# Patient Record
Sex: Female | Born: 1975 | Race: Black or African American | Hispanic: No | Marital: Single | Smoking: Former smoker
Health system: Southern US, Community
[De-identification: ages and names within clinical notes are randomized; demographics above are authoritative.]

## PROBLEM LIST (undated history)

## (undated) DIAGNOSIS — J45909 Unspecified asthma, uncomplicated: Secondary | ICD-10-CM

## (undated) DIAGNOSIS — E119 Type 2 diabetes mellitus without complications: Secondary | ICD-10-CM

## (undated) DIAGNOSIS — I10 Essential (primary) hypertension: Secondary | ICD-10-CM

## (undated) DIAGNOSIS — N83209 Unspecified ovarian cyst, unspecified side: Secondary | ICD-10-CM

## (undated) DIAGNOSIS — H409 Unspecified glaucoma: Secondary | ICD-10-CM

## (undated) DIAGNOSIS — H548 Legal blindness, as defined in USA: Secondary | ICD-10-CM

## (undated) HISTORY — PX: EYE SURGERY: SHX253

## (undated) HISTORY — DX: Unspecified ovarian cyst, unspecified side: N83.209

## (undated) HISTORY — DX: Legal blindness, as defined in USA: H54.8

## (undated) HISTORY — DX: Essential (primary) hypertension: I10

## (undated) HISTORY — DX: Unspecified glaucoma: H40.9

## (undated) HISTORY — DX: Type 2 diabetes mellitus without complications: E11.9

---

## 2001-10-09 HISTORY — PX: TUBAL LIGATION: SHX77

## 2012-10-03 DIAGNOSIS — H401190 Primary open-angle glaucoma, unspecified eye, stage unspecified: Secondary | ICD-10-CM | POA: Insufficient documentation

## 2012-12-17 DIAGNOSIS — Z961 Presence of intraocular lens: Secondary | ICD-10-CM | POA: Insufficient documentation

## 2013-03-31 DIAGNOSIS — I10 Essential (primary) hypertension: Secondary | ICD-10-CM | POA: Insufficient documentation

## 2013-03-31 DIAGNOSIS — J45909 Unspecified asthma, uncomplicated: Secondary | ICD-10-CM | POA: Insufficient documentation

## 2013-03-31 DIAGNOSIS — E139 Other specified diabetes mellitus without complications: Secondary | ICD-10-CM | POA: Insufficient documentation

## 2013-11-30 ENCOUNTER — Emergency Department: Payer: Self-pay | Admitting: Emergency Medicine

## 2014-02-22 ENCOUNTER — Emergency Department: Payer: Self-pay | Admitting: Emergency Medicine

## 2014-02-22 LAB — URINALYSIS, COMPLETE
BACTERIA: NONE SEEN
Bilirubin,UR: NEGATIVE
Blood: NEGATIVE
GLUCOSE, UR: NEGATIVE mg/dL (ref 0–75)
Ketone: NEGATIVE
NITRITE: NEGATIVE
PH: 6 (ref 4.5–8.0)
PROTEIN: NEGATIVE
SPECIFIC GRAVITY: 1.021 (ref 1.003–1.030)
Squamous Epithelial: 39
WBC UR: 4 /HPF (ref 0–5)

## 2014-02-22 LAB — COMPREHENSIVE METABOLIC PANEL
ALT: 16 U/L (ref 12–78)
ANION GAP: 8 (ref 7–16)
AST: 14 U/L — AB (ref 15–37)
Albumin: 3.9 g/dL (ref 3.4–5.0)
Alkaline Phosphatase: 120 U/L — ABNORMAL HIGH
BUN: 10 mg/dL (ref 7–18)
Bilirubin,Total: 0.4 mg/dL (ref 0.2–1.0)
CHLORIDE: 101 mmol/L (ref 98–107)
CO2: 27 mmol/L (ref 21–32)
CREATININE: 0.78 mg/dL (ref 0.60–1.30)
Calcium, Total: 10 mg/dL (ref 8.5–10.1)
EGFR (African American): >60
Glucose: 94 mg/dL (ref 65–99)
Osmolality: 271 (ref 275–301)
Potassium: 3 mmol/L — ABNORMAL LOW (ref 3.5–5.1)
SODIUM: 136 mmol/L (ref 136–145)
Total Protein: 8.3 g/dL — ABNORMAL HIGH (ref 6.4–8.2)

## 2014-02-22 LAB — CBC WITH DIFFERENTIAL/PLATELET
Basophil #: 0 10*3/uL (ref 0.0–0.1)
Basophil %: 0.6 %
EOS PCT: 1.5 %
Eosinophil #: 0.1 10*3/uL (ref 0.0–0.7)
HCT: 38.6 % (ref 35.0–47.0)
HGB: 13 g/dL (ref 12.0–16.0)
LYMPHS PCT: 21.7 %
Lymphocyte #: 1.5 10*3/uL (ref 1.0–3.6)
MCH: 29.7 pg (ref 26.0–34.0)
MCHC: 33.7 g/dL (ref 32.0–36.0)
MCV: 88 fL (ref 80–100)
MONOS PCT: 9.1 %
Monocyte #: 0.6 x10 3/mm (ref 0.2–0.9)
Neutrophil #: 4.6 10*3/uL (ref 1.4–6.5)
Neutrophil %: 67.1 %
Platelet: 320 10*3/uL (ref 150–440)
RBC: 4.38 10*6/uL (ref 3.80–5.20)
RDW: 14.4 % (ref 11.5–14.5)
WBC: 6.9 10*3/uL (ref 3.6–11.0)

## 2014-02-22 LAB — PREGNANCY, URINE: Pregnancy Test, Urine: NEGATIVE m[IU]/mL

## 2014-05-20 ENCOUNTER — Emergency Department: Payer: Self-pay | Admitting: Student

## 2014-11-03 DIAGNOSIS — H26499 Other secondary cataract, unspecified eye: Secondary | ICD-10-CM | POA: Insufficient documentation

## 2015-07-05 ENCOUNTER — Emergency Department
Admission: EM | Admit: 2015-07-05 | Discharge: 2015-07-05 | Disposition: A | Discharge status: Home / Self Care | Payer: Medicare Other | Attending: Emergency Medicine | Admitting: Emergency Medicine

## 2015-07-05 ENCOUNTER — Encounter: Payer: Self-pay | Admitting: Emergency Medicine

## 2015-07-05 ENCOUNTER — Emergency Department: Payer: Medicare Other

## 2015-07-05 DIAGNOSIS — N83201 Unspecified ovarian cyst, right side: Secondary | ICD-10-CM

## 2015-07-05 DIAGNOSIS — Z3202 Encounter for pregnancy test, result negative: Secondary | ICD-10-CM | POA: Insufficient documentation

## 2015-07-05 DIAGNOSIS — M25551 Pain in right hip: Secondary | ICD-10-CM | POA: Diagnosis not present

## 2015-07-05 DIAGNOSIS — Z79899 Other long term (current) drug therapy: Secondary | ICD-10-CM | POA: Diagnosis not present

## 2015-07-05 DIAGNOSIS — N8320 Unspecified ovarian cysts: Secondary | ICD-10-CM | POA: Insufficient documentation

## 2015-07-05 DIAGNOSIS — R109 Unspecified abdominal pain: Secondary | ICD-10-CM

## 2015-07-05 DIAGNOSIS — R102 Pelvic and perineal pain: Secondary | ICD-10-CM | POA: Diagnosis present

## 2015-07-05 HISTORY — DX: Unspecified asthma, uncomplicated: J45.909

## 2015-07-05 LAB — URINALYSIS COMPLETE WITH MICROSCOPIC (ARMC ONLY)
BILIRUBIN URINE: NEGATIVE
Bacteria, UA: NONE SEEN
Glucose, UA: NEGATIVE mg/dL
Hgb urine dipstick: NEGATIVE
Ketones, ur: NEGATIVE mg/dL
LEUKOCYTES UA: NEGATIVE
Nitrite: NEGATIVE
Protein, ur: NEGATIVE mg/dL
Specific Gravity, Urine: 1.019 (ref 1.005–1.030)
pH: 6 (ref 5.0–8.0)

## 2015-07-05 LAB — POCT PREGNANCY, URINE: Preg Test, Ur: NEGATIVE

## 2015-07-05 MED ORDER — TRAMADOL HCL 50 MG PO TABS: 50.0000 mg | ORAL_TABLET | Freq: Four times a day (QID) | ORAL | Status: AC | PRN

## 2015-07-05 MED ORDER — TRAMADOL HCL 50 MG PO TABS: 50.0000 mg | ORAL_TABLET | Freq: Four times a day (QID) | ORAL | Status: DC | PRN

## 2015-07-05 MED ORDER — KETOROLAC TROMETHAMINE 60 MG/2ML IM SOLN
60.0000 mg | Freq: Once | INTRAMUSCULAR | Status: AC
  Administered 2015-07-05: 60 mg via INTRAMUSCULAR
  Filled 2015-07-05: qty 2

## 2015-07-05 MED ORDER — IBUPROFEN 800 MG PO TABS: 800.0000 mg | ORAL_TABLET | Freq: Three times a day (TID) | ORAL | Status: DC | PRN

## 2015-07-05 NOTE — ED Provider Notes (Signed)
[facility] Emergency Department Provider Note  ____________________________________________  Time seen: Approximately 2:12 PM  I have reviewed the triage vital signs and the nursing notes.   HISTORY  Chief Complaint Hip Pain    HPI Lauren Bradford is a 39 y.o. female patient complaining of right lateral pelvic pain in set of hip pain. Patient stated onset was 6 PM last night. Patient denies any provocative incident for this pain. Patient stated this increased urinary frequency but denies dysuria hesitancy or vaginal discharge. Patient states she has a similar complaint about a year ago.   Past Medical History  Diagnosis Date  . Asthma     There are no active problems to display for this patient.   History reviewed. No pertinent past surgical history.  Current Outpatient Rx  Name  Route  Sig  Dispense  Refill  . albuterol (PROVENTIL HFA;VENTOLIN HFA) 108 (90 BASE) MCG/ACT inhaler   Inhalation   Inhale into the lungs every 6 (six) hours as needed for wheezing or shortness of breath.         Marland Kitchen ibuprofen (ADVIL,MOTRIN) 800 MG tablet   Oral   Take 1 tablet (800 mg total) by mouth every 8 (eight) hours as needed for moderate pain.   15 tablet   0   . traMADol (ULTRAM) 50 MG tablet   Oral   Take 1 tablet (50 mg total) by mouth every 6 (six) hours as needed for moderate pain.   12 tablet   0   . traMADol (ULTRAM) 50 MG tablet   Oral   Take 1 tablet (50 mg total) by mouth every 6 (six) hours as needed.   20 tablet   0     Allergies Review of patient's allergies indicates no known allergies.  No family history on file.  Social History Social History  Substance Use Topics  . Smoking status: Never Smoker   . Smokeless tobacco: None  . Alcohol Use: No    Review of Systems Constitutional: No fever/chills Eyes: No visual changes. ENT: No sore throat. Cardiovascular: Denies chest pain. Respiratory: Denies shortness of  breath. Gastrointestinal: No abdominal pain.  No nausea, no vomiting.  No diarrhea.  No constipation. Genitourinary: Pelvic pain Musculoskeletal: Negative for back pain. Skin: Negative for rash. Neurological: Negative for headaches, focal weakness or numbness. 10-point ROS otherwise negative.  ____________________________________________   PHYSICAL EXAM:  VITAL SIGNS: ED Triage Vitals  Enc Vitals Group     BP 07/05/15 1355 109/72 mmHg     Pulse Rate 07/05/15 1355 78     Resp 07/05/15 1355 20     Temp 07/05/15 1355 98 F (36.7 C)     Temp Source 07/05/15 1355 Oral     SpO2 07/05/15 1355 98 %     Weight 07/05/15 1355 207 lb (93.895 kg)     Height 07/05/15 1355  (1.651 m)     Head Cir --      Peak Flow --      Pain Score --      Pain Loc --      Pain Edu? --      Excl. in GC? --     Constitutional: Alert and oriented. Well appearing and in no acute distress. Eyes: Conjunctivae are normal. PERRL. EOMI. Head: Atraumatic. Nose: No congestion/rhinnorhea. Mouth/Throat: Mucous membranes are moist.  Oropharynx non-erythematous. Neck: No stridor.   Hematological/Lymphatic/Immunilogical: No cervical lymphadenopathy. Cardiovascular: Normal rate, regular rhythm. Grossly normal heart sounds.  Good  peripheral circulation. Respiratory: Normal respiratory effort.  No retractions. Lungs CTAB. Gastrointestinal: Soft and nontender. No distention. No abdominal bruits. No CVA tenderness. Musculoskeletal: No lower extremity tenderness nor edema.  No joint effusions. Neurologic:  Normal speech and language. No gross focal neurologic deficits are appreciated. No gait instability. Skin:  Skin is warm, dry and intact. No rash noted. Psychiatric: Mood and affect are normal. Speech and behavior are normal.  ____________________________________________   LABS (all labs ordered are listed, but only abnormal results are displayed)  Labs Reviewed  URINALYSIS COMPLETEWITH MICROSCOPIC ([facility]  ONLY) - Abnormal; Notable for the following:    Color, Urine YELLOW (*)    APPearance CLEAR (*)    Squamous Epithelial / LPF 0-5 (*)    All other components within normal limits  POC URINE PREG, ED  POCT PREGNANCY, URINE   ____________________________________________  EKG   ____________________________________________  RADIOLOGY  Ultrasound this is ovarian. ____________________________________________   PROCEDURES  Procedure(s) performed: None  Critical Care performed: No  ____________________________________________   INITIAL IMPRESSION / ASSESSMENT AND PLAN / ED COURSE  Pertinent labs & imaging results that were available during my care of the patient were reviewed by me and considered in my medical decision making (see chart for details).  Discussed ultrasound findings with palpation. Advised patient follow-up with GYN within the next 2-3 days. Patient given a work note for 2 days. Patient given prescription for tramadol and Motrin take as needed for pain. ____________________________________________   FINAL CLINICAL IMPRESSION(S) / ED DIAGNOSES  Final diagnoses:  Right ovarian cyst      Joni Reining, PA-C 07/05/15 1717  Joni Reining, PA-C 07/05/15 1718  Emily Filbert, MD 07/06/15 [phone]

## 2015-07-05 NOTE — ED Notes (Signed)
States she developed pain to right hip area  Yesterday   Denies any injury

## 2015-07-21 ENCOUNTER — Encounter: Payer: Self-pay | Admitting: Obstetrics and Gynecology

## 2015-07-21 ENCOUNTER — Ambulatory Visit (INDEPENDENT_AMBULATORY_CARE_PROVIDER_SITE_OTHER): Payer: Medicare Other | Admitting: Obstetrics and Gynecology

## 2015-07-21 VITALS — BP 111/76 | HR 80 | Ht 65.0 in | Wt 211.2 lb

## 2015-07-21 DIAGNOSIS — K5901 Slow transit constipation: Secondary | ICD-10-CM

## 2015-07-21 DIAGNOSIS — R102 Pelvic and perineal pain: Secondary | ICD-10-CM

## 2015-07-21 DIAGNOSIS — N83201 Unspecified ovarian cyst, right side: Secondary | ICD-10-CM | POA: Insufficient documentation

## 2015-07-21 MED ORDER — MAGNESIUM HYDROXIDE 400 MG/5ML PO SUSP: 30.0000 mL | Freq: Every day | ORAL | Status: DC | PRN

## 2015-07-21 MED ORDER — DOCUSATE SODIUM 100 MG PO CAPS: 100.0000 mg | ORAL_CAPSULE | Freq: Two times a day (BID) | ORAL | Status: DC | PRN

## 2015-07-21 NOTE — Progress Notes (Signed)
GYNECOLOGY PROGRESS NOTE  Subjective:    Patient ID: Lauren Bradford, female    DOB: September 11, 1976, 39 y.o.   MRN: [id number]  HPI  Patient is a 39 y.o. [id number] female who presents for f/u from ER visit for right pelvic pain/hip pain.  Was diagnosed with right ovarian cyst. Reports that the she was prescribed tramadol for pain, however takes sparingly as it is very strong and makes her drowsy.   Past Medical History  Diagnosis Date  . Asthma   . Ovarian cyst ER    right  . Advanced stage glaucoma     limited vision  . Hypertension    Family History  Problem Relation Age of Onset  . Diabetes Mother   . Diabetes Father   . Cancer Maternal Grandfather     lung  . Hyperlipidemia Mother   . Hypertension Mother   . Hypertension Father   . Stroke Father     Past Surgical History  Procedure Laterality Date  . Cesarean section  2002  . Tubal ligation  2003  . Eye surgery      several    Social History   Social History  . Marital Status: Single    Spouse Name: N/A  . Number of Children: N/A  . Years of Education: N/A   Occupational History  . sew    Social History Main Topics  . Smoking status: Former Games developermoker  . Smokeless tobacco: Never Used  . Alcohol Use: No  . Drug Use: No  . Sexual Activity:    Partners: Male   Other Topics Concern  . Not on file   Social History Narrative    Current Outpatient Prescriptions on File Prior to Visit  Medication Sig Dispense Refill  . albuterol (PROVENTIL HFA;VENTOLIN HFA) 108 (90 BASE) MCG/ACT inhaler Inhale into the lungs every 6 (six) hours as needed for wheezing or shortness of breath.    Marland Kitchen. ibuprofen (ADVIL,MOTRIN) 800 MG tablet Take 1 tablet (800 mg total) by mouth every 8 (eight) hours as needed for moderate pain. 15 tablet 0  . traMADol (ULTRAM) 50 MG tablet Take 1 tablet (50 mg total) by mouth every 6 (six) hours as needed. 20 tablet 0   No current facility-administered medications on file prior to visit.    No Known  Allergies   Review of Systems Constitutional: negative for chills, fatigue, fevers and sweats Eyes: negative for irritation, redness and visual disturbance Ears, nose, mouth, throat, and face: negative for hearing loss, nasal congestion, snoring and tinnitus Respiratory: negative for asthma, cough, sputum Cardiovascular: negative for chest pain, dyspnea, exertional chest pressure/discomfort, irregular heart beat, palpitations and syncope Gastrointestinal: positive for abdominal pain, constipation (nots that it has been over 1 week since last BM, and sometimes goes up to 2 weeks), negative for  nausea and vomiting Genitourinary: negative for abnormal menstrual periods, genital lesions, sexual problems and vaginal discharge, dysuria and urinary incontinence Integument/breast: negative for breast lump, breast tenderness and nipple discharge Hematologic/lymphatic: negative for bleeding and easy bruising Musculoskeletal:negative for back pain and muscle weakness Neurological: negative for dizziness, headaches, vertigo and weakness Endocrine: negative for diabetic symptoms including polydipsia, polyuria and skin dryness Allergic/Immunologic: negative for hay fever and urticaria     Pelvic Ultrasound 07/05/2015:  FINDINGS: Uterus - Measurements: 8.3 x 5.1 x 6.4 cm. No fibroids or other mass visualized.  Endometrium - Thickness: Normal thickness, 5 mm. No focal abnormality visualized.  Right ovary - Measurements: 4.4 x 2.5 x 3.3 cm.  Echogenic area within the right ovary measures 6 mm compared to 1.4 mm previously. This may represent a calcification or fat related to a small dermoid. This has decreased in size since prior study.  Left ovary - Measurements: 3.3 x 1.6 x 3.3 cm. Normal appearance/no adnexal mass.  Other findings - No free fluid.  IMPRESSION: No acute findings. Echogenic focus within the right ovary measuring 6 mm on today's study compared to 1.4 cm previously, possibly  calcifications versus small dermoid.   Objective:   Last menstrual period 06/24/2015. General appearance: alert and no distress Abdomen: soft, non-tender; bowel sounds normal; no masses,  no organomegaly Pelvic: deferred Extremities: extremities normal, atraumatic, no cyanosis or edema Neurologic: Grossly normal   Assessment:   Small right ovarian cyst, dermoid vs calcification.  Constipation  Plan:   Discussion had with patient that cyst is not likely cause of pain due to size.  Further discussion had with patient, and noted that she is currently dealing with constipation (has not had BM in over 1 week). Discussed that this could also be a cause of abdominal pain.  Discussed high fiber diet, use of stool softeners, limiting pain medications as much as possible.   To f/u in 1 week if no resolution of symptoms.    Hildred Laser, MD [facility]

## 2016-09-22 ENCOUNTER — Emergency Department
Admission: EM | Admit: 2016-09-22 | Discharge: 2016-09-22 | Disposition: A | Discharge status: Home / Self Care | Payer: Medicare Other | Attending: Emergency Medicine | Admitting: Emergency Medicine

## 2016-09-22 ENCOUNTER — Emergency Department: Payer: Medicare Other

## 2016-09-22 ENCOUNTER — Encounter: Payer: Self-pay | Admitting: Emergency Medicine

## 2016-09-22 DIAGNOSIS — Z87891 Personal history of nicotine dependence: Secondary | ICD-10-CM | POA: Insufficient documentation

## 2016-09-22 DIAGNOSIS — R0600 Dyspnea, unspecified: Secondary | ICD-10-CM | POA: Insufficient documentation

## 2016-09-22 DIAGNOSIS — E876 Hypokalemia: Secondary | ICD-10-CM | POA: Diagnosis not present

## 2016-09-22 DIAGNOSIS — R42 Dizziness and giddiness: Secondary | ICD-10-CM | POA: Diagnosis present

## 2016-09-22 DIAGNOSIS — I10 Essential (primary) hypertension: Secondary | ICD-10-CM | POA: Insufficient documentation

## 2016-09-22 DIAGNOSIS — J45909 Unspecified asthma, uncomplicated: Secondary | ICD-10-CM | POA: Diagnosis not present

## 2016-09-22 DIAGNOSIS — Z79899 Other long term (current) drug therapy: Secondary | ICD-10-CM | POA: Diagnosis not present

## 2016-09-22 LAB — CBC
HEMATOCRIT: 38.6 % (ref 35.0–47.0)
HEMOGLOBIN: 13 g/dL (ref 12.0–16.0)
MCH: 29.2 pg (ref 26.0–34.0)
MCHC: 33.6 g/dL (ref 32.0–36.0)
MCV: 86.9 fL (ref 80.0–100.0)
Platelets: 303 10*3/uL (ref 150–440)
RBC: 4.44 MIL/uL (ref 3.80–5.20)
RDW: 13.8 % (ref 11.5–14.5)
WBC: 4.8 10*3/uL (ref 3.6–11.0)

## 2016-09-22 LAB — MAGNESIUM: Magnesium: 1.8 mg/dL (ref 1.7–2.4)

## 2016-09-22 LAB — URINALYSIS, COMPLETE (UACMP) WITH MICROSCOPIC
BILIRUBIN URINE: NEGATIVE
GLUCOSE, UA: NEGATIVE mg/dL
Hgb urine dipstick: NEGATIVE
KETONES UR: NEGATIVE mg/dL
Leukocytes, UA: NEGATIVE
NITRITE: NEGATIVE
PH: 7 (ref 5.0–8.0)
PROTEIN: NEGATIVE mg/dL
RBC / HPF: NONE SEEN RBC/hpf (ref 0–5)
Specific Gravity, Urine: 1.018 (ref 1.005–1.030)
WBC UA: NONE SEEN WBC/hpf (ref 0–5)

## 2016-09-22 LAB — BASIC METABOLIC PANEL
ANION GAP: 6 (ref 5–15)
BUN: 13 mg/dL (ref 6–20)
CALCIUM: 9.1 mg/dL (ref 8.9–10.3)
CO2: 27 mmol/L (ref 22–32)
Chloride: 100 mmol/L — ABNORMAL LOW (ref 101–111)
Creatinine, Ser: 0.91 mg/dL (ref 0.44–1.00)
GFR calc non Af Amer: >60 mL/min (ref >60)
GLUCOSE: 120 mg/dL — AB (ref 65–99)
POTASSIUM: 2.4 mmol/L — AB (ref 3.5–5.1)
Sodium: 133 mmol/L — ABNORMAL LOW (ref 135–145)

## 2016-09-22 MED ORDER — POTASSIUM CHLORIDE CRYS ER 20 MEQ PO TBCR: 20.0000 meq | EXTENDED_RELEASE_TABLET | Freq: Every day | ORAL | 0 refills | Status: DC

## 2016-09-22 MED ORDER — POTASSIUM CHLORIDE CRYS ER 20 MEQ PO TBCR
40.0000 meq | EXTENDED_RELEASE_TABLET | Freq: Once | ORAL | Status: AC
  Administered 2016-09-22: 40 meq via ORAL
  Filled 2016-09-22: qty 2
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
## 2016-09-22 NOTE — ED Notes (Signed)
D/c inst to pt and family.   

## 2017-01-17 ENCOUNTER — Emergency Department (HOSPITAL_COMMUNITY)
Admission: EM | Admit: 2017-01-17 | Discharge: 2017-01-18 | Disposition: A | Discharge status: Home / Self Care | Payer: Medicare Other | Attending: Emergency Medicine | Admitting: Emergency Medicine

## 2017-01-17 ENCOUNTER — Encounter (HOSPITAL_COMMUNITY): Payer: Self-pay | Admitting: Emergency Medicine

## 2017-01-17 DIAGNOSIS — Y939 Activity, unspecified: Secondary | ICD-10-CM | POA: Insufficient documentation

## 2017-01-17 DIAGNOSIS — Z79899 Other long term (current) drug therapy: Secondary | ICD-10-CM | POA: Diagnosis not present

## 2017-01-17 DIAGNOSIS — S0592XA Unspecified injury of left eye and orbit, initial encounter: Secondary | ICD-10-CM | POA: Diagnosis present

## 2017-01-17 DIAGNOSIS — Z87891 Personal history of nicotine dependence: Secondary | ICD-10-CM | POA: Diagnosis not present

## 2017-01-17 DIAGNOSIS — W208XXA Other cause of strike by thrown, projected or falling object, initial encounter: Secondary | ICD-10-CM | POA: Diagnosis not present

## 2017-01-17 DIAGNOSIS — Y999 Unspecified external cause status: Secondary | ICD-10-CM | POA: Insufficient documentation

## 2017-01-17 DIAGNOSIS — I10 Essential (primary) hypertension: Secondary | ICD-10-CM | POA: Diagnosis not present

## 2017-01-17 DIAGNOSIS — S058X2A Other injuries of left eye and orbit, initial encounter: Secondary | ICD-10-CM

## 2017-01-17 DIAGNOSIS — J45909 Unspecified asthma, uncomplicated: Secondary | ICD-10-CM | POA: Diagnosis not present

## 2017-01-17 DIAGNOSIS — S01112A Laceration without foreign body of left eyelid and periocular area, initial encounter: Secondary | ICD-10-CM | POA: Diagnosis not present

## 2017-01-17 DIAGNOSIS — Y9259 Other trade areas as the place of occurrence of the external cause: Secondary | ICD-10-CM | POA: Diagnosis not present

## 2017-01-17 DIAGNOSIS — T148XXA Other injury of unspecified body region, initial encounter: Secondary | ICD-10-CM

## 2017-01-17 MED ORDER — FLUORESCEIN SODIUM 0.6 MG OP STRP
1.0000 | ORAL_STRIP | Freq: Once | OPHTHALMIC | Status: AC
  Administered 2017-01-17: 1 via OPHTHALMIC
  Filled 2017-01-17: qty 1

## 2017-01-17 MED ORDER — LIDOCAINE-EPINEPHRINE (PF) 2 %-1:200000 IJ SOLN
5.0000 mL | Freq: Once | INTRAMUSCULAR | Status: DC
  Filled 2017-01-17: qty 20

## 2017-01-17 MED ORDER — SILVER NITRATE-POT NITRATE 75-25 % EX MISC: 1.0000 | Freq: Once | CUTANEOUS | Status: DC

## 2017-01-17 MED ORDER — TETRACAINE HCL 0.5 % OP SOLN
2.0000 [drp] | Freq: Once | OPHTHALMIC | Status: AC
  Administered 2017-01-17: 2 [drp] via OPHTHALMIC
  Filled 2017-01-17: qty 4

## 2017-01-17 MED ORDER — TRANEXAMIC ACID 1000 MG/10ML IV SOLN
500.0000 mg | Freq: Once | INTRAVENOUS | Status: AC
  Administered 2017-01-17: 100 mg via TOPICAL
  Filled 2017-01-17: qty 10

## 2017-01-17 NOTE — ED Triage Notes (Signed)
[facility] in left eye. 2 to 3 cm laceration to left lower eyelid noted bleeding controlled pt arrived with eye patch in place per [place] personal reports [facility] [place].

## 2017-01-17 NOTE — ED Provider Notes (Signed)
[facility]-EMERGENCY DEPT Provider Note   CSN: [id number] Arrival date & time: 01/17/17  2121     History   Chief Complaint Chief Complaint  Patient presents with  . Eye Injury    HPI Lauren Bradford is a 41 y.o. female.  The history is provided by the patient.  Eye Injury  This is a new problem. The current episode started 1 to 2 hours ago. Episode frequency: once. Pertinent negatives include no chest pain, no abdominal pain, no headaches and no shortness of breath. Nothing aggravates the symptoms. Nothing relieves the symptoms. She has tried nothing for the symptoms.   Reports that a plastic bottle fell on her eye from the top shelf fall trying to get at [facility]. Denies any change in her vision.  Past Medical History:  Diagnosis Date  . Advanced stage glaucoma    limited vision  . Asthma   . Hypertension   . Ovarian cyst ER   right    Patient Active Problem List   Diagnosis Date Noted  . Right ovarian cyst 07/21/2015  . 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 Airway hyperreactivity 03/31/2013  . 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 BP (high blood pressure) 03/31/2013  . 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 Primary open angle glaucoma 10/03/2012    Past Surgical History:  Procedure Laterality Date  . CESAREAN SECTION  2002  . EYE SURGERY     several  . TUBAL LIGATION  2003    OB History    Gravida Para Term Preterm AB Living   SAB TAB Ectopic Multiple Live Births           3       Home Medications    Prior to Admission medications   Medication Sig Start Date End Date Taking? Authorizing Provider  acetaZOLAMIDE (DIAMOX) 250 MG tablet Take 500 mg by mouth daily.  07/10/15  Yes Historical Provider, MD  albuterol (PROVENTIL HFA;VENTOLIN HFA) 108 (90 BASE) MCG/ACT inhaler Inhale into the lungs every 6 (six) hours as needed for wheezing or shortness of breath.   Yes Historical Provider, MD  brimonidine (ALPHAGAN P) 0.1 %  SOLN Apply 1 drop to eye 3 (three) times daily.  09/12/14  Yes Historical Provider, MD  Difluprednate 0.05 % EMUL Place 1 drop into the right eye 2 (two) times daily.  09/21/14  Yes Historical Provider, MD  hydrochlorothiazide (HYDRODIURIL) 25 MG tablet Take 25 mg by mouth daily.  08/13/09  Yes Historical Provider, MD  ibuprofen (ADVIL,MOTRIN) 800 MG tablet Take 1 tablet (800 mg total) by mouth every 8 (eight) hours as needed for moderate pain. 07/05/15  Yes Joni Reining, PA-C  potassium chloride SA (KLOR-CON M20) 20 MEQ tablet Take 1 tablet (20 mEq total) by mouth daily. 09/22/16  Yes Loleta Rose, MD    Family History Family History  Problem Relation Age of Onset  . Diabetes Mother   . Hyperlipidemia Mother   . Hypertension Mother   . Diabetes Father   . Hypertension Father   . Stroke Father   . Cancer Maternal Grandfather     lung    Social History Social History  Substance Use Topics  . Smoking status: Former Games developer  . Smokeless tobacco: Never Used  . Alcohol use No     Allergies   Prednisone   Review of Systems Review of Systems  Respiratory: Negative for shortness of breath.  Cardiovascular: Negative for chest pain.  Gastrointestinal: Negative for abdominal pain.  Neurological: Negative for headaches.     Physical Exam Updated Vital Signs BP 118/82   Pulse 76   Temp 97.8 F (36.6 C) (Oral)   Resp 18   Ht  (1.651 m)   Wt 189 lb (85.7 kg)   LMP 01/17/2017   SpO2 97%   BMI 31.45 kg/m   Physical Exam  Constitutional: She is oriented to person, place, and time. She appears well-developed and well-nourished. No distress.  HENT:  Head: Normocephalic and atraumatic.  Right Ear: External ear normal.  Left Ear: External ear normal.  Nose: Nose normal.  Eyes: Conjunctivae and EOM are normal. No scleral icterus.  Slit lamp exam:      The right eye shows no corneal flare, no hyphema and no anterior chamber bulge.       The left eye shows no corneal  abrasion, no corneal flare, no foreign body, no hyphema, no fluorescein uptake and no anterior chamber bulge.    Iris changes secondary to ocular surgery (unchanged per family)  IOP R:21 (baseline per pt 16-17) L:4 (baseline per pt 5-6).  No seidel's sign.  Neck: Normal range of motion and phonation normal.  Cardiovascular: Normal rate and regular rhythm.   Pulmonary/Chest: Effort normal. No stridor. No respiratory distress.  Abdominal: She exhibits no distension.  Musculoskeletal: Normal range of motion. She exhibits no edema.  Neurological: She is alert and oriented to person, place, and time.  Skin: She is not diaphoretic.  Psychiatric: She has a normal mood and affect. Her behavior is normal.  Vitals reviewed.    ED Treatments / Results  Labs (all labs ordered are listed, but only abnormal results are displayed) Labs Reviewed - No data to display  EKG  EKG Interpretation None       Radiology No results found.  Procedures Procedures (including critical care time)  Medications Ordered in ED Medications  fluorescein ophthalmic strip 1 strip (not administered)  tetracaine (PONTOCAINE) 0.5 % ophthalmic solution 2 drop (not administered)  silver nitrate applicators applicator 1 Stick (not administered)  lidocaine-EPINEPHrine (XYLOCAINE W/EPI) 2 %-1:200000 (PF) injection 5 mL (not administered)  bacitracin 500 UNIT/GM ointment (not administered)  tranexamic acid (CYKLOKAPRON) injection 500 mg (100 mg Topical Given 01/17/17 2240)     Initial Impression / Assessment and Plan / ED Course  I have reviewed the triage vital signs and the nursing notes.  Pertinent labs & imaging results that were available during my care of the patient were reviewed by me and considered in my medical decision making (see chart for details).     Periorbital trauma resulting in left lower leg skin tear. No other signs of ocular trauma. Low suspicion for globe rupture. Skin tear was actively  bleeding and became hemostatic following topical TXA administration. Skin tear was thoroughly irrigated. No indication for primary closure at this time. We will allow for secondary closure.  The patient is safe for discharge with strict return precautions.   Final Clinical Impressions(s) / ED Diagnoses   Final diagnoses:  Eye trauma, superficial, left, initial encounter  Skin avulsion   Disposition: Discharge  Condition: Good  I have discussed the results, Dx and Tx plan with the patient who expressed understanding and agree(s) with the plan. Discharge instructions discussed at great length. The patient was given strict return precautions who verbalized understanding of the instructions. No further questions at time of discharge.    New Prescriptions  No medications on file    Follow Up: Derwood Kaplan, MD [address] [place] [address] [phone]  Call  As needed  Ophthalmology   as scheduled      Nira Conn, MD 01/18/17 0100

## 2017-01-18 MED ORDER — BACITRACIN ZINC 500 UNIT/GM EX OINT
TOPICAL_OINTMENT | CUTANEOUS | Status: AC
  Administered 2017-01-18: 01:00:00
  Filled 2017-01-18: qty 1.8

## 2017-07-04 ENCOUNTER — Encounter (HOSPITAL_COMMUNITY): Payer: Self-pay | Admitting: Emergency Medicine

## 2017-07-04 ENCOUNTER — Emergency Department (HOSPITAL_COMMUNITY)
Admission: EM | Admit: 2017-07-04 | Discharge: 2017-07-04 | Disposition: A | Discharge status: Home / Self Care | Payer: Medicare Other | Attending: Emergency Medicine | Admitting: Emergency Medicine

## 2017-07-04 ENCOUNTER — Emergency Department (HOSPITAL_COMMUNITY): Payer: Medicare Other

## 2017-07-04 DIAGNOSIS — Z79899 Other long term (current) drug therapy: Secondary | ICD-10-CM | POA: Insufficient documentation

## 2017-07-04 DIAGNOSIS — Z87891 Personal history of nicotine dependence: Secondary | ICD-10-CM | POA: Insufficient documentation

## 2017-07-04 DIAGNOSIS — R0789 Other chest pain: Secondary | ICD-10-CM | POA: Insufficient documentation

## 2017-07-04 DIAGNOSIS — I10 Essential (primary) hypertension: Secondary | ICD-10-CM | POA: Insufficient documentation

## 2017-07-04 DIAGNOSIS — J45909 Unspecified asthma, uncomplicated: Secondary | ICD-10-CM | POA: Insufficient documentation

## 2017-07-04 LAB — BASIC METABOLIC PANEL
ANION GAP: 7 (ref 5–15)
BUN: 8 mg/dL (ref 6–20)
CALCIUM: 9 mg/dL (ref 8.9–10.3)
CHLORIDE: 100 mmol/L — AB (ref 101–111)
CO2: 27 mmol/L (ref 22–32)
Creatinine, Ser: 0.71 mg/dL (ref 0.44–1.00)
GFR calc non Af Amer: >60 mL/min (ref >60)
GLUCOSE: 142 mg/dL — AB (ref 65–99)
Potassium: 2.6 mmol/L — CL (ref 3.5–5.1)
Sodium: 134 mmol/L — ABNORMAL LOW (ref 135–145)

## 2017-07-04 LAB — CBC
HEMATOCRIT: 37.4 % (ref 36.0–46.0)
HEMOGLOBIN: 12.6 g/dL (ref 12.0–15.0)
MCH: 30.2 pg (ref 26.0–34.0)
MCHC: 33.7 g/dL (ref 30.0–36.0)
MCV: 89.7 fL (ref 78.0–100.0)
Platelets: 253 10*3/uL (ref 150–400)
RBC: 4.17 MIL/uL (ref 3.87–5.11)
RDW: 13.5 % (ref 11.5–15.5)
WBC: 5 10*3/uL (ref 4.0–10.5)

## 2017-07-04 LAB — I-STAT TROPONIN, ED
Troponin i, poc: 0 ng/mL (ref 0.00–0.08)
Troponin i, poc: 0 ng/mL (ref 0.00–0.08)

## 2017-07-04 LAB — D-DIMER, QUANTITATIVE: D-Dimer, Quant: 0.34 ug{FEU}/mL (ref 0.00–0.50)

## 2017-07-04 MED ORDER — MAGNESIUM OXIDE 400 (241.3 MG) MG PO TABS
800.0000 mg | ORAL_TABLET | Freq: Once | ORAL | Status: AC
  Administered 2017-07-04: 800 mg via ORAL
  Filled 2017-07-04: qty 2

## 2017-07-04 MED ORDER — IBUPROFEN 400 MG PO TABS
600.0000 mg | ORAL_TABLET | Freq: Once | ORAL | Status: AC
  Administered 2017-07-04: 600 mg via ORAL
  Filled 2017-07-04: qty 1

## 2017-07-04 MED ORDER — POTASSIUM CHLORIDE CRYS ER 20 MEQ PO TBCR
40.0000 meq | EXTENDED_RELEASE_TABLET | Freq: Once | ORAL | Status: AC
  Administered 2017-07-04: 40 meq via ORAL
  Filled 2017-07-04: qty 2

## 2017-07-04 MED ORDER — IBUPROFEN 800 MG PO TABS: 800.0000 mg | ORAL_TABLET | Freq: Three times a day (TID) | ORAL | 0 refills | Status: DC | PRN

## 2017-07-04 NOTE — ED Notes (Signed)
Pt ambulated to restroom with RN assistance.  °

## 2017-07-04 NOTE — ED Provider Notes (Signed)
[facility]-EMERGENCY DEPT Provider Note   CSN: [id number] Arrival date & time: 07/04/17  1191     History   Chief Complaint Chief Complaint  Patient presents with  . Chest Pain   HPI  Ms. Daloia is a 41yo female with PMH significant for HTN and asthma who presents with chest pain that started this morning. She was getting on to her [employer] transportation when she developed acute stabbing substernal/left sided chest pain. The pain is worse with inspiration and movement. Radiates to her right arm/shoulder and neck. Not associated with dyspnea, diaphoresis, or nausea. Has had some chest pain before but not this bad. Has not tried anything for the pain. She did say she thinks she received aspirin. Denies fevers, chills, cough, rashes, or leg swelling. No recent travel, hiking in the woods, or sick contacts. She does not normally get acid reflux symptoms. No recent trauma although she does work out three times a week. Last worked out at [employer] on Monday.  Does not smoke, drinks alcohol socially, and occasional marijuana use (last used ~3 weeks ago). No personal history of heart disease. Dad did have a heart attack around age ~late 64s. Mother did have blood clots.  Past Medical History:  Diagnosis Date  . Advanced stage glaucoma    limited vision  . Asthma   . Hypertension   . Ovarian cyst ER   right    Patient Active Problem List   Diagnosis Date Noted  . Right ovarian cyst 07/21/2015  . 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 Airway hyperreactivity 03/31/2013  . 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 BP (high blood pressure) 03/31/2013  . 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 Primary open angle glaucoma 10/03/2012    Past Surgical History:  Procedure Laterality Date  . CESAREAN SECTION  2002  . EYE SURGERY     several  . TUBAL LIGATION  2003    OB History    Gravida Para Term Preterm AB Living   SAB TAB Ectopic Multiple Live Births      3       Home Medications    Prior to Admission medications   Medication Sig Start Date End Date Taking? Authorizing Provider  acetaZOLAMIDE (DIAMOX) 250 MG tablet Take 500 mg by mouth daily.  07/10/15   [provider]  albuterol (PROVENTIL HFA;VENTOLIN HFA) 108 (90 BASE) MCG/ACT inhaler Inhale into the lungs every 6 (six) hours as needed for wheezing or shortness of breath.    [provider]  brimonidine (ALPHAGAN P) 0.1 % SOLN Apply 1 drop to eye 3 (three) times daily.  09/12/14   [provider]  Difluprednate 0.05 % EMUL Place 1 drop into the right eye 2 (two) times daily.  09/21/14   [provider]  hydrochlorothiazide (HYDRODIURIL) 25 MG tablet Take 25 mg by mouth daily.  08/13/09   [provider]  ibuprofen (ADVIL,MOTRIN) 800 MG tablet Take 1 tablet (800 mg total) by mouth every 8 (eight) hours as needed for moderate pain. 07/04/17   Scherrie Gerlach, MD  potassium chloride SA (KLOR-CON M20) 20 MEQ tablet Take 1 tablet (20 mEq total) by mouth daily. 09/22/16   Loleta Rose, MD    Family History Family History  Problem Relation Age of Onset  . Diabetes Mother   . Hyperlipidemia Mother   . Hypertension Mother   . Diabetes Father   . Hypertension Father   .  Stroke Father   . Cancer Maternal Grandfather        lung    Social History Social History  Substance Use Topics  . Smoking status: Former Games developer  . Smokeless tobacco: Never Used  . Alcohol use No     Allergies   Prednisone   Review of Systems Review of Systems  Unremarkable except as stated above in HPI.  Physical Exam Updated Vital Signs BP (!) 141/96   Pulse 77   Temp 98 F (36.7 C) (Oral)   Resp 15   LMP 06/14/2017 (Exact Date)   SpO2 97%   Physical Exam GEN: Well-appearing female sitting in bed in NAD. Alert and oriented. HENT: Moist mucous membranes. No visible lesions. EYES: PERRL. Sclera anicteric. RESP: Clear to auscultation bilaterally. No  wheezes, rales, or rhonchi. CV: Normal rate and regular rhythm. No murmurs, gallops, or rubs. No carotid bruits. No LE edema. Tenderness to palpation on anterior chest wall. ABD: Soft. Non-tender. Non-distended. Normoactive bowel sounds. EXT: No edema. Warm and well perfused. NEURO: Cranial nerves II-XII grossly intact. Able to lift all four extremities against gravity. Speech fluent and appropriate. PSYCH: Patient is calm and pleasant. Appropriate affect. Well-groomed; speech is appropriate and on-subject.  ED Treatments / Results  Labs (all labs ordered are listed, but only abnormal results are displayed) Labs Reviewed  BASIC METABOLIC PANEL - Abnormal; Notable for the following:       Result Value   Sodium 134 (*)    Potassium 2.6 (*)    Chloride 100 (*)    Glucose, Bld 142 (*)    All other components within normal limits  CBC  D-DIMER, QUANTITATIVE (NOT AT [facility])  I-STAT TROPONIN, ED  I-STAT TROPONIN, ED    EKG  EKG Interpretation  Date/Time:  Wednesday July 04 2017 07:40:48 EDT Ventricular Rate:  82 PR Interval:  156 QRS Duration: 88 QT Interval:  400 QTC Calculation: 467 R Axis:   -9 Text Interpretation:  Normal sinus rhythm Normal ECG No significant change since last tracing Confirmed by Melene Plan [phone]) on 07/04/2017 8:40:00 AM       Radiology Dg Chest 2 View  Result Date: 07/04/2017 CLINICAL DATA:  Chest pain at work. EXAM: CHEST  2 VIEW COMPARISON:  September 22, 2016 FINDINGS: The heart size and mediastinal contours are within normal limits. Both lungs are clear. The visualized skeletal structures are unremarkable. IMPRESSION: No active cardiopulmonary disease. Electronically Signed   By: Sherian Rein M.D.   On: 07/04/2017 08:19    Procedures Procedures (including critical care time)  Medications Ordered in ED Medications  potassium chloride SA (K-DUR,KLOR-CON) CR tablet 40 mEq (40 mEq Oral Given 07/04/17 1013)  magnesium oxide (MAG-OX) tablet 800 mg  (800 mg Oral Given 07/04/17 1013)  ibuprofen (ADVIL,MOTRIN) tablet 600 mg (600 mg Oral Given 07/04/17 1013)    Initial Impression / Assessment and Plan / ED Course  I have reviewed the triage vital signs and the nursing notes.  Pertinent labs & imaging results that were available during my care of the patient were reviewed by me and considered in my medical decision making (see chart for details).  Ms. Chaney is a 41yo female with PMH significant for HTN and asthma who presents with acute onset of stabbing chest pain that started this morning while getting onto her [employer] transportation vehicle. Pain is reproducible with palpation. Troponin neg x1. EKG with NSR. CXR normal. CBC wnl, BMP with K 2.6. Unlikely to be cardiac in etiology  and unlikely to be PE (PERC negative). However, patient does have pleuritic chest pain and mother has history of blood clots, so will get D-Dimer. Will replete with PO potassium and magnesium. Delta troponin and treat pain with ibuprofen.  1:29pm Patient reports improvement in pain with ibuprofen. D-Dimer negative and second troponin negative. Chest pain likely MSK in etiology. Ibuprofen  prescription and return precautions provided. Patient stable for discharge.  Patient seen and discussed with Dr. Rush Landmark.  Final Clinical Impressions(s) / ED Diagnoses   Final diagnoses:  Chest wall pain    New Prescriptions Current Discharge Medication List       Scherrie Gerlach, MD 07/04/17 1331    Tegeler, Canary Brim, MD 07/05/17 1007

## 2017-07-04 NOTE — ED Triage Notes (Signed)
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 Pain is increased with rom. Pt a/ox4, resp e/u, nad.

## 2017-07-04 NOTE — Discharge Instructions (Signed)
Your chest pain is likely due to the muscles or bones in that area. You can take ibuprofen  every 8 hours as needed.  Follow up with your outpatient doctor as needed.  If your symptoms get worse or you develop worsening shortness of breath, fever, feeling sweaty, or nausea and vomiting, please get help.

## 2017-08-07 ENCOUNTER — Emergency Department (HOSPITAL_COMMUNITY)
Admission: EM | Admit: 2017-08-07 | Discharge: 2017-08-07 | Disposition: A | Discharge status: Home / Self Care | Payer: Medicare Other | Attending: Emergency Medicine | Admitting: Emergency Medicine

## 2017-08-07 DIAGNOSIS — Z87891 Personal history of nicotine dependence: Secondary | ICD-10-CM | POA: Diagnosis not present

## 2017-08-07 DIAGNOSIS — E876 Hypokalemia: Secondary | ICD-10-CM | POA: Diagnosis not present

## 2017-08-07 DIAGNOSIS — Z008 Encounter for other general examination: Secondary | ICD-10-CM | POA: Diagnosis not present

## 2017-08-07 DIAGNOSIS — J45909 Unspecified asthma, uncomplicated: Secondary | ICD-10-CM | POA: Diagnosis not present

## 2017-08-07 DIAGNOSIS — I10 Essential (primary) hypertension: Secondary | ICD-10-CM | POA: Diagnosis not present

## 2017-08-07 DIAGNOSIS — Z79899 Other long term (current) drug therapy: Secondary | ICD-10-CM | POA: Insufficient documentation

## 2017-08-07 DIAGNOSIS — Z046 Encounter for general psychiatric examination, requested by authority: Secondary | ICD-10-CM | POA: Diagnosis not present

## 2017-08-07 LAB — CBC WITH DIFFERENTIAL/PLATELET
BASOS PCT: 0 %
Basophils Absolute: 0 10*3/uL (ref 0.0–0.1)
EOS ABS: 0 10*3/uL (ref 0.0–0.7)
Eosinophils Relative: 1 %
HCT: 39.2 % (ref 36.0–46.0)
HEMOGLOBIN: 13.7 g/dL (ref 12.0–15.0)
Lymphocytes Relative: 23 %
Lymphs Abs: 1.5 10*3/uL (ref 0.7–4.0)
MCH: 31.2 pg (ref 26.0–34.0)
MCHC: 34.9 g/dL (ref 30.0–36.0)
MCV: 89.3 fL (ref 78.0–100.0)
Monocytes Absolute: 0.6 10*3/uL (ref 0.1–1.0)
Monocytes Relative: 9 %
NEUTROS PCT: 67 %
Neutro Abs: 4.4 10*3/uL (ref 1.7–7.7)
PLATELETS: 300 10*3/uL (ref 150–400)
RBC: 4.39 MIL/uL (ref 3.87–5.11)
RDW: 12.9 % (ref 11.5–15.5)
WBC: 6.5 10*3/uL (ref 4.0–10.5)

## 2017-08-07 LAB — RAPID URINE DRUG SCREEN, HOSP PERFORMED
AMPHETAMINES: NOT DETECTED
BENZODIAZEPINES: NOT DETECTED
Barbiturates: NOT DETECTED
COCAINE: NOT DETECTED
Opiates: NOT DETECTED
Tetrahydrocannabinol: POSITIVE — AB

## 2017-08-07 LAB — ETHANOL

## 2017-08-07 LAB — COMPREHENSIVE METABOLIC PANEL
ALBUMIN: 4.5 g/dL (ref 3.5–5.0)
ALK PHOS: 90 U/L (ref 38–126)
ALT: 19 U/L (ref 14–54)
ANION GAP: 12 (ref 5–15)
AST: 19 U/L (ref 15–41)
BUN: 19 mg/dL (ref 6–20)
CALCIUM: 9.6 mg/dL (ref 8.9–10.3)
CHLORIDE: 103 mmol/L (ref 101–111)
CO2: 24 mmol/L (ref 22–32)
CREATININE: 0.87 mg/dL (ref 0.44–1.00)
GFR calc Af Amer: >60 mL/min (ref >60)
GFR calc non Af Amer: >60 mL/min (ref >60)
GLUCOSE: 145 mg/dL — AB (ref 65–99)
Potassium: 2.9 mmol/L — ABNORMAL LOW (ref 3.5–5.1)
SODIUM: 139 mmol/L (ref 135–145)
Total Bilirubin: 0.4 mg/dL (ref 0.3–1.2)
Total Protein: 8.2 g/dL — ABNORMAL HIGH (ref 6.5–8.1)

## 2017-08-07 LAB — ACETAMINOPHEN LEVEL: Acetaminophen (Tylenol), Serum: <10 ug/mL — ABNORMAL LOW (ref 10–30)

## 2017-08-07 LAB — I-STAT BETA HCG BLOOD, ED (MC, WL, AP ONLY)

## 2017-08-07 LAB — SALICYLATE LEVEL: Salicylate Lvl: <7 mg/dL (ref 2.8–30.0)

## 2017-08-07 MED ORDER — POTASSIUM CHLORIDE CRYS ER 20 MEQ PO TBCR
20.0000 meq | EXTENDED_RELEASE_TABLET | Freq: Every day | ORAL | Status: DC
  Filled 2017-08-07: qty 1

## 2017-08-07 MED ORDER — POTASSIUM CHLORIDE CRYS ER 20 MEQ PO TBCR
40.0000 meq | EXTENDED_RELEASE_TABLET | Freq: Once | ORAL | Status: AC
  Administered 2017-08-07: 40 meq via ORAL
  Filled 2017-08-07: qty 2

## 2017-08-07 MED ORDER — BRIMONIDINE TARTRATE 0.15 % OP SOLN
1.0000 [drp] | Freq: Three times a day (TID) | OPHTHALMIC | Status: DC
  Administered 2017-08-07: 1 [drp] via OPHTHALMIC
  Filled 2017-08-07: qty 5

## 2017-08-07 MED ORDER — HYDROCHLOROTHIAZIDE 25 MG PO TABS
25.0000 mg | ORAL_TABLET | Freq: Every day | ORAL | Status: DC
  Administered 2017-08-07: 25 mg via ORAL
  Filled 2017-08-07: qty 1

## 2017-08-07 MED ORDER — DIFLUPREDNATE 0.05 % OP EMUL
1.0000 [drp] | Freq: Two times a day (BID) | OPHTHALMIC | Status: DC
  Administered 2017-08-07: 1 [drp] via OPHTHALMIC

## 2017-08-07 MED ORDER — ALBUTEROL SULFATE HFA 108 (90 BASE) MCG/ACT IN AERS: 2.0000 | INHALATION_SPRAY | Freq: Four times a day (QID) | RESPIRATORY_TRACT | Status: DC | PRN

## 2017-08-07 MED ORDER — ACETAZOLAMIDE 250 MG PO TABS
500.0000 mg | ORAL_TABLET | Freq: Every day | ORAL | Status: DC
  Administered 2017-08-07: 500 mg via ORAL
  Filled 2017-08-07: qty 2

## 2017-08-07 MED ORDER — IBUPROFEN 800 MG PO TABS: 800.0000 mg | ORAL_TABLET | Freq: Three times a day (TID) | ORAL | Status: DC | PRN

## 2017-08-07 NOTE — BHH Counselor (Signed)
Spoke to Janice Coffinyesha Hensarling (daughter) who states that she just "wanted to make sure her mom got the help she needs" and if "we feel safe to discharge her she is ok with that". Daughter has no other safety concerns and states that she just feels that they need some space from each other and to make alternative living arrangements so mom is not living with her anymore. Pt has also spoke to the doctor and NP about moving out of her daughters house and states that the [employer] will help her with this transition. Spoke to Dr. Sharma CovertNorman about this collateral information and she agrees that pt is safe for discharge.   [address] Peg [address] StreetKristin Naidelin Gugliotta MerrimanLPC, LCAS

## 2017-08-07 NOTE — Discharge Instructions (Addendum)
For your behavioral health needs, you are advised to follow up with an outpatient therapist.  Contact one of the following providers at your earliest opportunity to ask about scheduling an intake appointment:       [facility]      [address]      [place], [place] [address]      [phone]       [facility]      [address]      [place], [place] [address]      [phone]       [facility]      [address]      [place], [place] [address]      [phone]

## 2017-08-07 NOTE — BH Assessment (Signed)
[facility] Progress Note  Per Juanetta BeetsJacqueline Norman, DO, this pt does not require psychiatric hospitalization at this time.  [facility] to follow up with an outpatient therapist.  Discharge instructions include referral information for the [facility] [place], for [facility], and for [facility].  Pt's nurse, Aram BeechamCynthia, has been notified.  Doylene Canninghomas Analeya Luallen, MA Triage Specialist [phone]
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
## 2017-08-07 NOTE — ED Notes (Signed)
Patient gave verbal consent for her daughter to have the keys out of her pocketbook.
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
## 2017-11-14 ENCOUNTER — Emergency Department (HOSPITAL_COMMUNITY)
Admission: EM | Admit: 2017-11-14 | Discharge: 2017-11-14 | Discharge status: Against Medical Advice | Payer: Medicare Other | Attending: Emergency Medicine | Admitting: Emergency Medicine

## 2017-11-14 ENCOUNTER — Encounter (HOSPITAL_COMMUNITY): Payer: Self-pay | Admitting: Emergency Medicine

## 2017-11-14 DIAGNOSIS — Z5321 Procedure and treatment not carried out due to patient leaving prior to being seen by health care provider: Secondary | ICD-10-CM | POA: Diagnosis not present

## 2017-11-14 DIAGNOSIS — G43909 Migraine, unspecified, not intractable, without status migrainosus: Secondary | ICD-10-CM | POA: Diagnosis present

## 2017-11-14 NOTE — ED Notes (Signed)
Pt called for blood work multiple times with no response

## 2017-11-14 NOTE — ED Triage Notes (Signed)
Pt comes in today via EMS from home with complaints of migraine.  Pt is legally blind. Pt states her PCP called her and told her her potassium levels were off. Pt does not appear to be in any acute distress at this time. Vitals WNL in route.

## 2017-11-14 NOTE — ED Notes (Signed)
Second call for blood draw with no answer.. 

## 2017-11-14 NOTE — ED Notes (Signed)
No answer for blood draw @ this time. 

## 2018-09-29 IMAGING — CT CT HEAD W/O CM
3 series · 15 of 46 positions shown, 18 images · non-contrast
Comparison: None.

CLINICAL DATA: Headache, dizziness and nausea.

EXAM:
CT HEAD WITHOUT CONTRAST
TECHNIQUE: Contiguous axial images were obtained from the base of the skull
through the vertex without intravenous contrast.

[Series 3: head wo · axial · 0.40mm/px · z∈[-136,-16]mm · 9 of 29 slices shown, 12 images]
[im 3/29  brain]
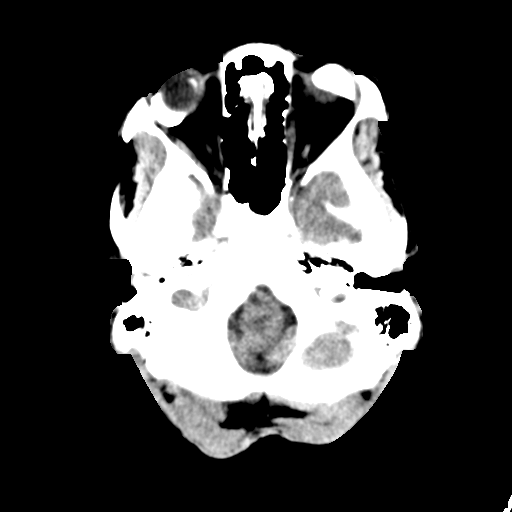
[im 3/29  bone]
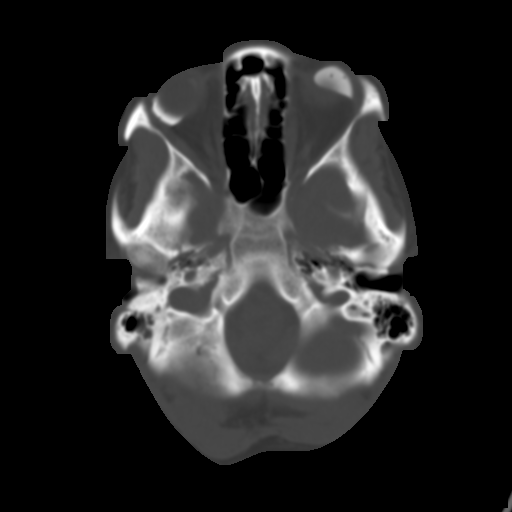
[im 6/29  brain]
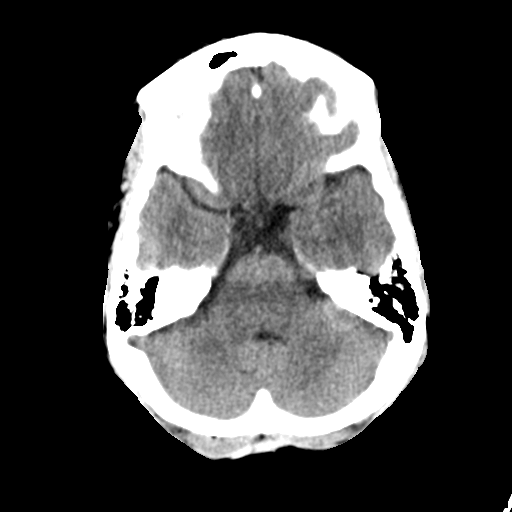
[im 9/29  brain]
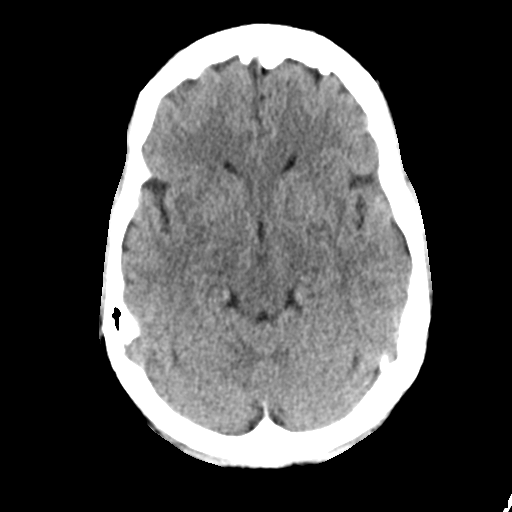
[im 12/29  brain]
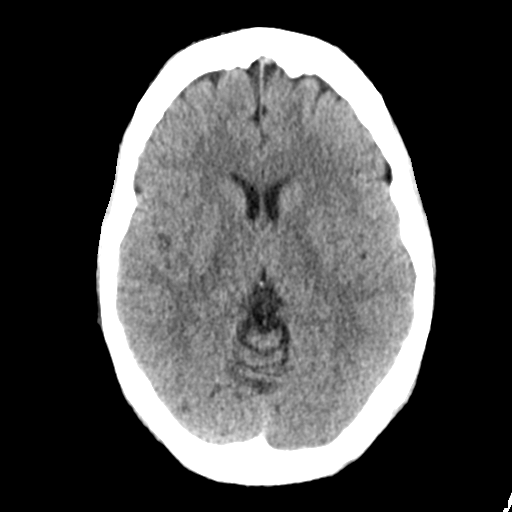
[im 15/29  brain]
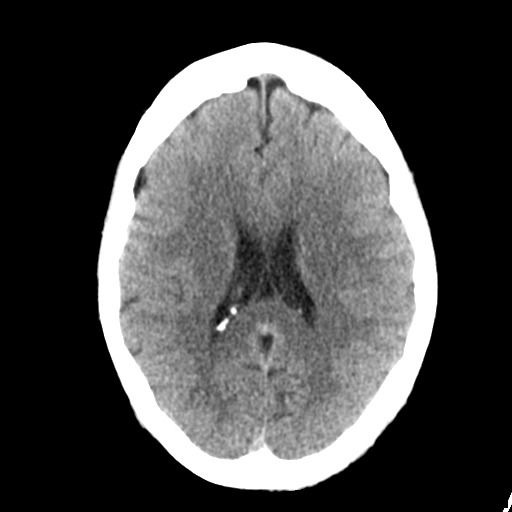
[im 15/29  bone]
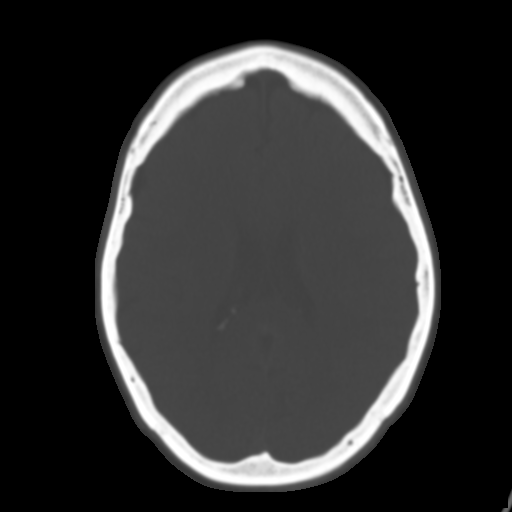
[im 18/29  brain]
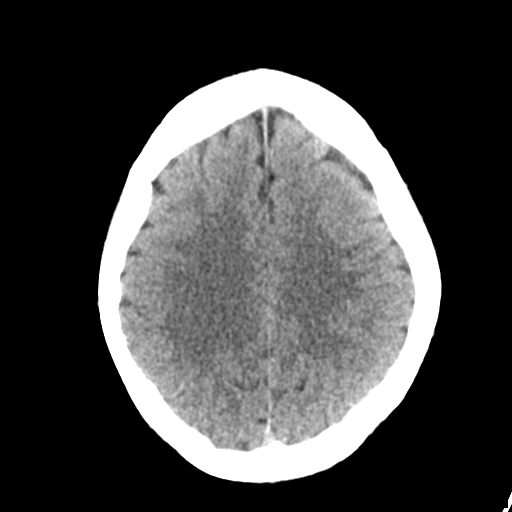
[im 21/29  brain]
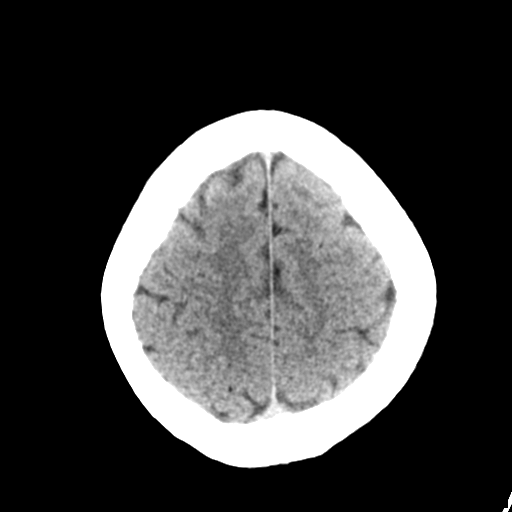
[im 24/29  brain]
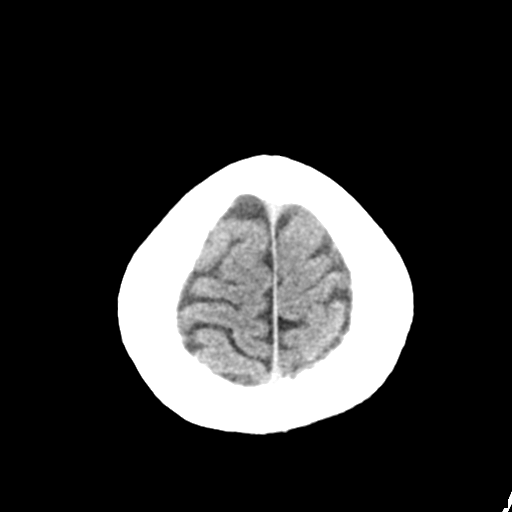
[im 27/29  brain]
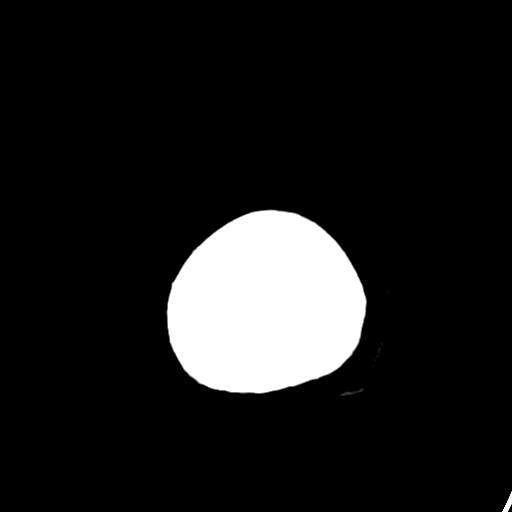
[im 27/29  bone]
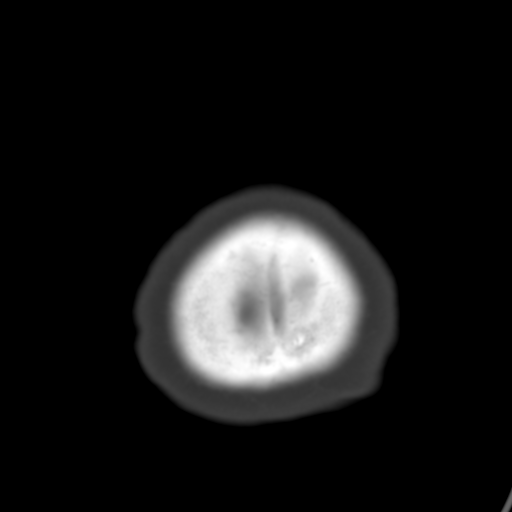

[Series 5: coronal soft tissue · coronal · 0.29mm/px · 3 of 67 slices shown]
[im 23/67  brain]
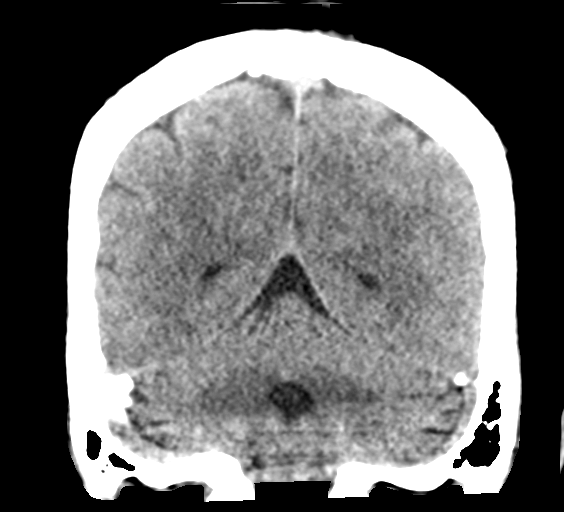
[im 30/67  brain]
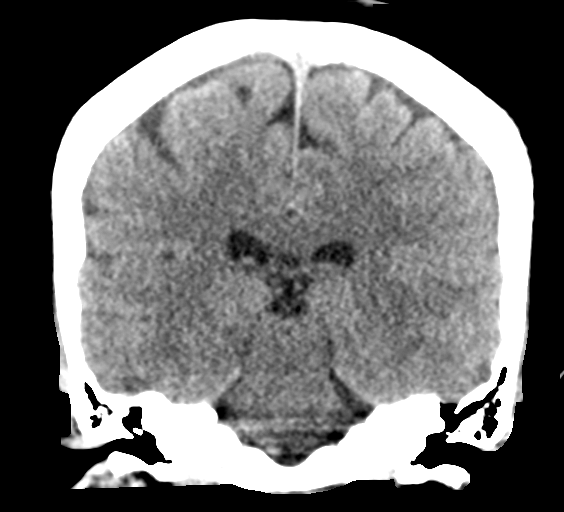
[im 37/67  brain]
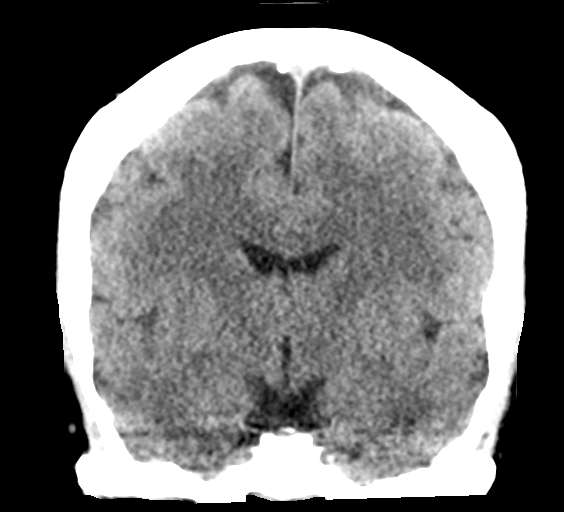

[Series 6: sagittal soft tissue · sagittal · 0.29mm/px · 3 of 49 slices shown]
[im 17/49  brain]
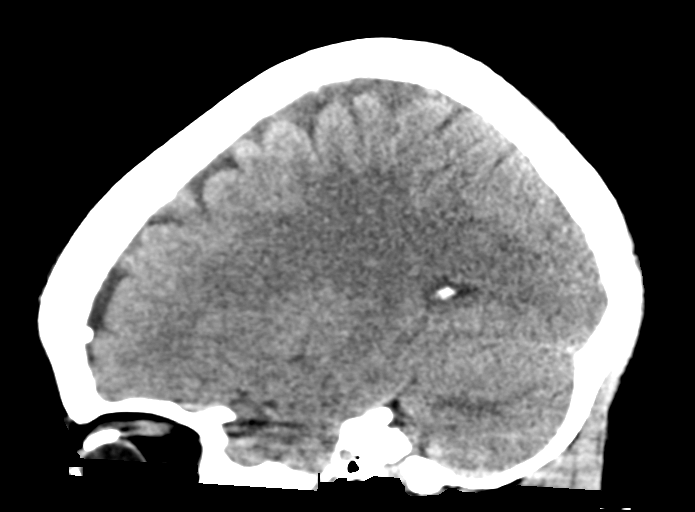
[im 25/49  brain]
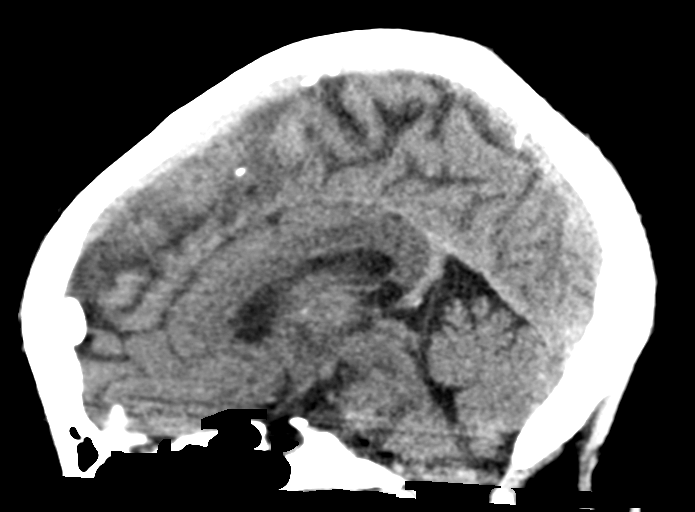
[im 33/49  brain]
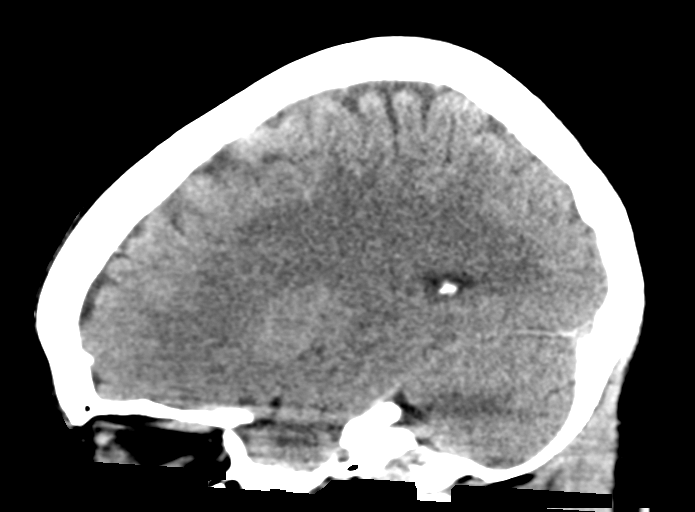

[15 of 46 positions shown; findings below may reference images not displayed]

FINDINGS: Brain: No evidence of acute infarction, hemorrhage, hydrocephalus,
extra-axial collection or mass lesion/mass effect.

Vascular: No hyperdense vessel or unexpected calcification.

Skull: Normal. Negative for fracture or focal lesion.

Sinuses/Orbits: No acute finding.

Other: None.
IMPRESSION: No acute intracranial abnormality.

## 2019-09-25 ENCOUNTER — Other Ambulatory Visit: Payer: Self-pay

## 2019-09-25 ENCOUNTER — Emergency Department (HOSPITAL_COMMUNITY)
Admission: EM | Admit: 2019-09-25 | Discharge: 2019-09-25 | Disposition: A | Discharge status: Home / Self Care | Payer: Medicare Other | Attending: Emergency Medicine | Admitting: Emergency Medicine

## 2019-09-25 ENCOUNTER — Encounter (HOSPITAL_COMMUNITY): Payer: Self-pay

## 2019-09-25 ENCOUNTER — Emergency Department (HOSPITAL_COMMUNITY): Payer: Medicare Other

## 2019-09-25 DIAGNOSIS — R109 Unspecified abdominal pain: Secondary | ICD-10-CM | POA: Diagnosis present

## 2019-09-25 DIAGNOSIS — R1030 Lower abdominal pain, unspecified: Secondary | ICD-10-CM | POA: Insufficient documentation

## 2019-09-25 DIAGNOSIS — I10 Essential (primary) hypertension: Secondary | ICD-10-CM | POA: Diagnosis not present

## 2019-09-25 DIAGNOSIS — Z79899 Other long term (current) drug therapy: Secondary | ICD-10-CM | POA: Insufficient documentation

## 2019-09-25 DIAGNOSIS — Z87891 Personal history of nicotine dependence: Secondary | ICD-10-CM | POA: Insufficient documentation

## 2019-09-25 LAB — COMPREHENSIVE METABOLIC PANEL
ALT: 17 U/L (ref 0–44)
AST: 15 U/L (ref 15–41)
Albumin: 3.8 g/dL (ref 3.5–5.0)
Alkaline Phosphatase: 105 U/L (ref 38–126)
Anion gap: 11 (ref 5–15)
BUN: 12 mg/dL (ref 6–20)
CO2: 22 mmol/L (ref 22–32)
Calcium: 8.9 mg/dL (ref 8.9–10.3)
Chloride: 103 mmol/L (ref 98–111)
Creatinine, Ser: 0.63 mg/dL (ref 0.44–1.00)
GFR calc Af Amer: >60 mL/min (ref >60)
GFR calc non Af Amer: >60 mL/min (ref >60)
Glucose, Bld: 160 mg/dL — ABNORMAL HIGH (ref 70–99)
Potassium: 3.1 mmol/L — ABNORMAL LOW (ref 3.5–5.1)
Sodium: 136 mmol/L (ref 135–145)
Total Bilirubin: 0.4 mg/dL (ref 0.3–1.2)
Total Protein: 7.7 g/dL (ref 6.5–8.1)

## 2019-09-25 LAB — CBC
HCT: 41.1 % (ref 36.0–46.0)
Hemoglobin: 13.6 g/dL (ref 12.0–15.0)
MCH: 30.3 pg (ref 26.0–34.0)
MCHC: 33.1 g/dL (ref 30.0–36.0)
MCV: 91.5 fL (ref 80.0–100.0)
Platelets: 302 10*3/uL (ref 150–400)
RBC: 4.49 MIL/uL (ref 3.87–5.11)
RDW: 13.4 % (ref 11.5–15.5)
WBC: 6.9 10*3/uL (ref 4.0–10.5)
nRBC: 0 % (ref 0.0–0.2)

## 2019-09-25 LAB — URINALYSIS, ROUTINE W REFLEX MICROSCOPIC
Bilirubin Urine: NEGATIVE
Glucose, UA: NEGATIVE mg/dL
Hgb urine dipstick: NEGATIVE
Ketones, ur: NEGATIVE mg/dL
Leukocytes,Ua: NEGATIVE
Nitrite: NEGATIVE
Protein, ur: NEGATIVE mg/dL
Specific Gravity, Urine: 1.024 (ref 1.005–1.030)
pH: 6 (ref 5.0–8.0)

## 2019-09-25 LAB — I-STAT BETA HCG BLOOD, ED (MC, WL, AP ONLY): I-stat hCG, quantitative: <5 m[IU]/mL (ref <5)

## 2019-09-25 LAB — LIPASE, BLOOD: Lipase: 24 U/L (ref 11–51)

## 2019-09-25 MED ORDER — SODIUM CHLORIDE 0.9% FLUSH
3.0000 mL | Freq: Once | INTRAVENOUS | Status: AC
  Administered 2019-09-25: 17:00:00 3 mL via INTRAVENOUS

## 2019-09-25 MED ORDER — DICYCLOMINE HCL 20 MG PO TABS: 20.0000 mg | ORAL_TABLET | Freq: Three times a day (TID) | ORAL | 0 refills | Status: DC | PRN

## 2019-09-25 MED ORDER — MORPHINE SULFATE (PF) 4 MG/ML IV SOLN
4.0000 mg | Freq: Once | INTRAVENOUS | Status: AC
  Administered 2019-09-25: 4 mg via INTRAVENOUS
  Filled 2019-09-25: qty 1

## 2019-09-25 MED ORDER — SODIUM CHLORIDE 0.9 % IV BOLUS
1000.0000 mL | Freq: Once | INTRAVENOUS | Status: AC
  Administered 2019-09-25: 17:00:00 1000 mL via INTRAVENOUS

## 2019-09-25 MED ORDER — ONDANSETRON HCL 4 MG/2ML IJ SOLN
4.0000 mg | Freq: Once | INTRAMUSCULAR | Status: AC
  Administered 2019-09-25: 4 mg via INTRAVENOUS
  Filled 2019-09-25: qty 2

## 2019-09-25 MED ORDER — IOHEXOL 300 MG/ML  SOLN
100.0000 mL | Freq: Once | INTRAMUSCULAR | Status: AC | PRN
  Administered 2019-09-25: 18:00:00 100 mL via INTRAVENOUS

## 2019-09-25 MED ORDER — SODIUM CHLORIDE (PF) 0.9 % IJ SOLN
INTRAMUSCULAR | Status: AC
  Filled 2019-09-25: qty 50

## 2019-09-25 NOTE — ED Triage Notes (Signed)
Patient c/o abdominal pain 08/25/19. Patient states her last BM was 1-2 weeks ago. Patient states she has taken stool softeners, gas X and pepto Bismol. Patient states pain is worse after eating.  Patient went to [facility] today and was told to come to the ED.

## 2019-09-25 NOTE — ED Notes (Signed)
Awaiting ride

## 2019-10-05 NOTE — ED Provider Notes (Signed)
[place] DEPT Provider Note   CSN: [id number] Arrival date & time: 09/25/19  1334     History Chief Complaint  Patient presents with  . Abdominal Pain    Lauren Bradford is a 43 y.o. female.  HPI   43 year old female with abdominal pain.  Symptom onset about a month ago.  Pain waxes and wanes.  Diffuse.  Crampy and sometimes sharper pain.  She is tried multiple over-the-counter medications including Gas-X, Pepto-Bismol and stool softeners without improvement.  The pain is often worse 20 to 30 minutes after eating.  She reports infrequent bowel movements.  No change in symptoms after she defecates.  No urinary symptoms.  No fevers or chills.  No nausea or vomiting.  Past Medical History:  Diagnosis Date  . Advanced stage glaucoma    limited vision  . Asthma   . Hypertension   . Ovarian cyst ER   right    Patient Active Problem List   Diagnosis Date Noted  . Right ovarian cyst 07/21/2015  . 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 Airway hyperreactivity 03/31/2013  . 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 BP (high blood pressure) 03/31/2013  . 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 Primary open angle glaucoma 10/03/2012    Past Surgical History:  Procedure Laterality Date  . CESAREAN SECTION  2002  . EYE SURGERY     several  . TUBAL LIGATION  2003     OB History    Gravida  3   Para  3   Term  3   Preterm      AB      Living  3     SAB      TAB      Ectopic      Multiple      Live Births  3           Family History  Problem Relation Age of Onset  . Diabetes Mother   . Hyperlipidemia Mother   . Hypertension Mother   . Diabetes Father   . Hypertension Father   . Stroke Father   . Cancer Maternal Grandfather        lung    Social History   Tobacco Use  . Smoking status: Former Research scientist (life sciences)  . Smokeless tobacco: Never Used  Substance Use Topics  . Alcohol use: No   Alcohol/week: 0.0 standard drinks  . Drug use: No    Home Medications Prior to Admission medications   Medication Sig Start Date End Date Taking? Authorizing Provider  acetaZOLAMIDE (DIAMOX) 250 MG tablet Take 500 mg by mouth daily.  07/10/15  Yes [provider]  albuterol (PROVENTIL HFA;VENTOLIN HFA) 108 (90 BASE) MCG/ACT inhaler Inhale into the lungs every 6 (six) hours as needed for wheezing or shortness of breath.   Yes [provider]  brimonidine (ALPHAGAN P) 0.1 % SOLN Apply 1 drop to eye 3 (three) times daily.  09/12/14  Yes [provider]  cyanocobalamin (,VITAMIN B-12,) 1000 MCG/ML injection Inject 1,000 mcg into the muscle every 30 (thirty) days. 08/11/19  Yes [provider]  Difluprednate 0.05 % EMUL Place 1 drop into the right eye 2 (two) times daily.  09/21/14  Yes [provider]  fexofenadine (ALLEGRA) 180 MG tablet Take 180 mg by mouth daily as needed for allergies. 05/05/19  Yes [provider]  FLUoxetine (PROZAC) 20 MG capsule Take 20 mg by mouth daily. 08/16/19  Yes [provider]  hydrochlorothiazide (HYDRODIURIL) 25 MG tablet Take 25 mg by mouth daily.  08/13/09  Yes [provider]  ibuprofen (ADVIL,MOTRIN) 800 MG tablet Take 1 tablet (800 mg total) by mouth every 8 (eight) hours as needed for moderate pain. 07/04/17  Yes Scherrie Gerlach, MD  potassium chloride SA (KLOR-CON M20) 20 MEQ tablet Take 1 tablet (20 mEq total) by mouth daily. 09/22/16  Yes Loleta Rose, MD  traMADol (ULTRAM) 50 MG tablet Take 50 mg by mouth 4 (four) times daily as needed. 07/07/19  Yes [provider]  Travoprost, BAK Free, (TRAVATAN) 0.004 % SOLN ophthalmic solution Place 1 drop into the right eye at bedtime. 08/15/19  Yes [provider]  dicyclomine (BENTYL) 20 MG tablet Take 1 tablet (20 mg total) by mouth every 8 (eight) hours as needed for spasms (abdominal spasm/pain). 09/25/19   Raeford Razor, MD     Allergies    Prednisone  Review of Systems   Review of Systems   All systems reviewed and negative, other than as noted in HPI.  Physical Exam Updated Vital Signs BP (!) 141/98 (BP Location: Right Arm)   Pulse 89   Temp 98.5 F (36.9 C) (Oral)   Resp 16   Ht 5\' 5"  (1.651 m)   Wt 103 kg   LMP 08/25/2019   SpO2 99%   BMI 37.77 kg/m   Physical Exam Vitals and nursing note reviewed.  Constitutional:      General: She is not in acute distress.    Appearance: She is well-developed.  HENT:     Head: Normocephalic and atraumatic.  Eyes:     General:        Right eye: No discharge.        Left eye: No discharge.     Conjunctiva/sclera: Conjunctivae normal.  Cardiovascular:     Rate and Rhythm: Normal rate and regular rhythm.     Heart sounds: Normal heart sounds. No murmur. No friction rub. No gallop.   Pulmonary:     Effort: Pulmonary effort is normal. No respiratory distress.     Breath sounds: Normal breath sounds.  Abdominal:     General: There is no distension.     Palpations: Abdomen is soft.     Tenderness: There is abdominal tenderness.  Musculoskeletal:        General: No tenderness.     Cervical back: Neck supple.  Skin:    General: Skin is warm and dry.  Neurological:     Mental Status: She is alert.  Psychiatric:        Behavior: Behavior normal.        Thought Content: Thought content normal.     ED Results / Procedures / Treatments   Labs (all labs ordered are listed, but only abnormal results are displayed) Labs Reviewed  COMPREHENSIVE METABOLIC PANEL - Abnormal; Notable for the following components:      Result Value   Potassium 3.1 (*)    Glucose, Bld 160 (*)    All other components within normal limits  LIPASE, BLOOD  CBC  URINALYSIS, ROUTINE W REFLEX MICROSCOPIC  I-STAT BETA HCG BLOOD, ED ([facility], [facility], [facility] ONLY)    EKG None  Radiology No results found.   CT ABDOMEN PELVIS W CONTRAST  Result Date: 09/25/2019 CLINICAL DATA:   Abdominal pain EXAM: CT ABDOMEN AND PELVIS WITH CONTRAST TECHNIQUE: Multidetector CT imaging of the abdomen and pelvis was performed using the standard protocol following bolus administration of  intravenous contrast. CONTRAST:  100mL OMNIPAQUE IOHEXOL 300 MG/ML  SOLN COMPARISON:  None. FINDINGS: Lower chest: Lung bases are clear. No effusions. Heart is normal size. Hepatobiliary: 3.4 cm low-density lesion in the liver adjacent to the gallbladder fossa. This appears to have peripheral puddling of contrast suggesting hemangioma, but warrants follow-up or further characterisation. Gallbladder unremarkable. No biliary ductal dilatation. Pancreas: No focal abnormality or ductal dilatation. Spleen: No focal abnormality.  Normal size. Adrenals/Urinary Tract: Small cyst in the midpole of the right kidney. No suspicious renal or adrenal mass. No hydronephrosis. Urinary bladder decompressed, unremarkable. Stomach/Bowel: Sigmoid diverticulosis. No active diverticulitis. Normal appendix. Stomach and small bowel decompressed, unremarkable. Vascular/Lymphatic: Aortic atherosclerosis. No enlarged abdominal or pelvic lymph nodes. Reproductive: Bilateral tubal ligation clips. Uterus and adnexa unremarkable. No mass. Other: No free fluid or free air. Musculoskeletal: No acute bony abnormality. IMPRESSION: 3.4 cm low-density lesion in the liver adjacent to the gallbladder fossa. I favor this represents hemangioma with peripheral contrast puddling, but recommend further characterization with elective MRI. Sigmoid diverticulosis.  No active diverticulitis. Aortic atherosclerosis. Electronically Signed   By: Charlett NoseKevin  Dover M.D.   On: 09/25/2019 17:56    Procedures Procedures (including critical care time)  Medications Ordered in ED Medications  sodium chloride flush (NS) 0.9 % injection 3 mL (3 mLs Intravenous Given 09/25/19 1719)  sodium chloride 0.9 % bolus 1,000 mL (0 mLs Intravenous Stopped 09/25/19 1844)  ondansetron  (ZOFRAN) injection 4 mg (4 mg Intravenous Given 09/25/19 1718)  morphine 4 MG/ML injection 4 mg (4 mg Intravenous Given 09/25/19 1718)  iohexol (OMNIPAQUE) 300 MG/ML solution 100 mL (100 mLs Intravenous Contrast Given 09/25/19 1742)  sodium chloride (PF) 0.9 % injection (  Given by Other 09/25/19 1756)    ED Course  I have reviewed the triage vital signs and the nursing notes.  Pertinent labs & imaging results that were available during my care of the patient were reviewed by me and considered in my medical decision making (see chart for details).    MDM Rules/Calculators/A&P                      43 year old female with abdominal pain.  She describes it as diffuse but she was tender more in the lower abdomen.  No clear focality.  Imaging fairly unremarkable muscles other labs.  Low suspicion for serious intra-abdominal/pelvic process.  Return precautions were discussed.  Outpatient follow-up otherwise.  We will try as needed Bentyl. Final Clinical Impression(s) / ED Diagnoses Final diagnoses:  Lower abdominal pain    Rx / DC Orders ED Discharge Orders         Ordered    dicyclomine (BENTYL) 20 MG tablet  Every 8 hours PRN     09/25/19 1812           Raeford RazorKohut, Aahana Elza, MD 10/05/19 [phone]

## 2019-11-16 ENCOUNTER — Other Ambulatory Visit: Payer: Self-pay

## 2019-11-16 ENCOUNTER — Emergency Department (HOSPITAL_COMMUNITY)
Admission: EM | Admit: 2019-11-16 | Discharge: 2019-11-17 | Disposition: A | Discharge status: Home / Self Care | Payer: Medicare Other | Attending: Emergency Medicine | Admitting: Emergency Medicine

## 2019-11-16 DIAGNOSIS — Z87891 Personal history of nicotine dependence: Secondary | ICD-10-CM | POA: Insufficient documentation

## 2019-11-16 DIAGNOSIS — E119 Type 2 diabetes mellitus without complications: Secondary | ICD-10-CM | POA: Insufficient documentation

## 2019-11-16 DIAGNOSIS — M7918 Myalgia, other site: Secondary | ICD-10-CM | POA: Insufficient documentation

## 2019-11-16 DIAGNOSIS — W010XXA Fall on same level from slipping, tripping and stumbling without subsequent striking against object, initial encounter: Secondary | ICD-10-CM | POA: Diagnosis not present

## 2019-11-16 DIAGNOSIS — M546 Pain in thoracic spine: Secondary | ICD-10-CM | POA: Insufficient documentation

## 2019-11-16 DIAGNOSIS — Z79899 Other long term (current) drug therapy: Secondary | ICD-10-CM | POA: Diagnosis not present

## 2019-11-16 DIAGNOSIS — Y999 Unspecified external cause status: Secondary | ICD-10-CM | POA: Diagnosis not present

## 2019-11-16 DIAGNOSIS — I10 Essential (primary) hypertension: Secondary | ICD-10-CM | POA: Insufficient documentation

## 2019-11-16 DIAGNOSIS — Z91040 Latex allergy status: Secondary | ICD-10-CM | POA: Insufficient documentation

## 2019-11-16 DIAGNOSIS — Y939 Activity, unspecified: Secondary | ICD-10-CM | POA: Insufficient documentation

## 2019-11-16 DIAGNOSIS — W19XXXA Unspecified fall, initial encounter: Secondary | ICD-10-CM

## 2019-11-16 DIAGNOSIS — R0789 Other chest pain: Secondary | ICD-10-CM

## 2019-11-16 DIAGNOSIS — Y929 Unspecified place or not applicable: Secondary | ICD-10-CM | POA: Diagnosis not present

## 2019-11-17 ENCOUNTER — Emergency Department (HOSPITAL_COMMUNITY): Payer: Medicare Other

## 2019-11-17 ENCOUNTER — Encounter (HOSPITAL_COMMUNITY): Payer: Self-pay

## 2019-11-17 DIAGNOSIS — M7918 Myalgia, other site: Secondary | ICD-10-CM | POA: Diagnosis not present

## 2019-11-17 MED ORDER — IBUPROFEN 600 MG PO TABS: 600.0000 mg | ORAL_TABLET | Freq: Four times a day (QID) | ORAL | 0 refills | Status: DC | PRN

## 2019-11-17 MED ORDER — METHOCARBAMOL 750 MG PO TABS: 750.0000 mg | ORAL_TABLET | Freq: Two times a day (BID) | ORAL | 0 refills | Status: DC

## 2019-11-17 MED ORDER — IBUPROFEN 200 MG PO TABS
600.0000 mg | ORAL_TABLET | Freq: Once | ORAL | Status: AC
  Administered 2019-11-17: 600 mg via ORAL
  Filled 2019-11-17: qty 3

## 2019-11-17 MED ORDER — OXYCODONE-ACETAMINOPHEN 5-325 MG PO TABS
1.0000 | ORAL_TABLET | Freq: Once | ORAL | Status: AC
  Administered 2019-11-17: 1 via ORAL
  Filled 2019-11-17: qty 1

## 2019-11-17 NOTE — ED Triage Notes (Signed)
Patient states she feel 1x week ago on to her right side with loc, states pain began this Friday in her lower back, right shoulder and spreading to her chest, has been taking Asprin and ibuprofen to no relief

## 2019-11-17 NOTE — Discharge Instructions (Signed)
Take the medications as prescribed. If no better in 2-3 days, please see your doctor for recheck and any further evaluation needed.   Return to the emergency department with any new or worsening symptoms.

## 2019-11-17 NOTE — ED Provider Notes (Signed)
[place] DEPT Provider Note   CSN: [id number] Arrival date & time: 11/16/19  2352     History Chief Complaint  Patient presents with  . Fall  . Back Pain  . Chest Pain    Lauren Bradford is a 44 y.o. female.  Patient to ED with complaint of right sided chest pain and upper back pain after mechanical fall 2 days ago. She reports tripping and landing forcefully on the right shoulder, hitting her head as well. She has been taking ibuprofen 200 mg without relief. The pain is worse with movement, better with rest. No SOB, nausea, vomiting, cough, fever. She has gone back to work and is able to perform her usual duties but reports pain with activity.   The history is provided by the patient. No language interpreter was used.  Fall Associated symptoms include chest pain. Pertinent negatives include no headaches and no shortness of breath.  Back Pain Associated symptoms: chest pain   Associated symptoms: no fever, no headaches, no numbness and no weakness   Chest Pain Associated symptoms: back pain   Associated symptoms: no cough, no diaphoresis, no fever, no headache, no nausea, no numbness, no shortness of breath and no weakness        Past Medical History:  Diagnosis Date  . Advanced stage glaucoma    limited vision  . Asthma   . Hypertension   . Ovarian cyst ER   right    Patient Active Problem List   Diagnosis Date Noted  . Right ovarian cyst 07/21/2015  . 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 Airway hyperreactivity 03/31/2013  . 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 BP (high blood pressure) 03/31/2013  . 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 Primary open angle glaucoma 10/03/2012    Past Surgical History:  Procedure Laterality Date  . CESAREAN SECTION  2002  . EYE SURGERY     several  . TUBAL LIGATION  2003     OB History    Gravida  3   Para  3   Term  3   Preterm      AB      Living  3       SAB      TAB      Ectopic      Multiple      Live Births  3           Family History  Problem Relation Age of Onset  . Diabetes Mother   . Hyperlipidemia Mother   . Hypertension Mother   . Diabetes Father   . Hypertension Father   . Stroke Father   . Cancer Maternal Grandfather        lung    Social History   Tobacco Use  . Smoking status: Former Research scientist (life sciences)  . Smokeless tobacco: Never Used  Substance Use Topics  . Alcohol use: No    Alcohol/week: 0.0 standard drinks  . Drug use: No    Home Medications Prior to Admission medications   Medication Sig Start Date End Date Taking? Authorizing Provider  acetaZOLAMIDE (DIAMOX) 250 MG tablet Take 500 mg by mouth daily.  07/10/15   [provider]  albuterol (PROVENTIL HFA;VENTOLIN HFA) 108 (90 BASE) MCG/ACT inhaler Inhale into the lungs every 6 (six) hours as needed for wheezing or shortness of breath.    [provider]  brimonidine (ALPHAGAN P) 0.1 % SOLN Apply 1 drop to eye 3 (three)  times daily.  09/12/14   [provider]  cyanocobalamin (,VITAMIN B-12,) 1000 MCG/ML injection Inject 1,000 mcg into the muscle every 30 (thirty) days. 08/11/19   [provider]  dicyclomine (BENTYL) 20 MG tablet Take 1 tablet (20 mg total) by mouth every 8 (eight) hours as needed for spasms (abdominal spasm/pain). 09/25/19   Raeford Razor, MD  Difluprednate 0.05 % EMUL Place 1 drop into the right eye 2 (two) times daily.  09/21/14   [provider]  fexofenadine (ALLEGRA) 180 MG tablet Take 180 mg by mouth daily as needed for allergies. 05/05/19   [provider]  FLUoxetine (PROZAC) 20 MG capsule Take 20 mg by mouth daily. 08/16/19   [provider]  hydrochlorothiazide (HYDRODIURIL) 25 MG tablet Take 25 mg by mouth daily.  08/13/09   [provider]  ibuprofen (ADVIL,MOTRIN) 800 MG tablet Take 1 tablet (800 mg total) by mouth every 8 (eight) hours as needed for moderate  pain. 07/04/17   Scherrie Gerlach, MD  potassium chloride SA (KLOR-CON M20) 20 MEQ tablet Take 1 tablet (20 mEq total) by mouth daily. 09/22/16   Loleta Rose, MD  traMADol (ULTRAM) 50 MG tablet Take 50 mg by mouth 4 (four) times daily as needed. 07/07/19   [provider]  Travoprost, BAK Free, (TRAVATAN) 0.004 % SOLN ophthalmic solution Place 1 drop into the right eye at bedtime. 08/15/19   [provider]    Allergies    Prednisone, Latex, and Yeast-related products  Review of Systems   Review of Systems  Constitutional: Negative for chills, diaphoresis and fever.  HENT: Negative.   Respiratory: Negative.  Negative for cough and shortness of breath.   Cardiovascular: Positive for chest pain.  Gastrointestinal: Negative.  Negative for nausea.  Musculoskeletal: Positive for back pain.  Skin: Negative.   Neurological: Negative.  Negative for weakness, numbness and headaches.    Physical Exam Updated Vital Signs BP (!) 141/95 (BP Location: Left Arm)   Pulse 99   Temp 98.4 F (36.9 C) (Oral)   Resp (!) 29   Ht 5\' 5"  (1.651 m)   Wt 100.2 kg   SpO2 97%   BMI 36.78 kg/m   Physical Exam Vitals and nursing note reviewed.  Constitutional:      Appearance: She is well-developed.  HENT:     Head: Normocephalic.  Cardiovascular:     Rate and Rhythm: Normal rate and regular rhythm.  Pulmonary:     Effort: Pulmonary effort is normal.     Breath sounds: Normal breath sounds. No wheezing, rhonchi or rales.  Chest:     Chest wall: Tenderness (Right upper chest tenderness. ) present.  Abdominal:     General: Bowel sounds are normal.     Palpations: Abdomen is soft.     Tenderness: There is no abdominal tenderness. There is no guarding or rebound.  Musculoskeletal:        General: Normal range of motion.     Cervical back: Normal range of motion and neck supple.     Comments: Mild upper right paraspinal thoracic tenderness without swelling. No midline cervical  tenderness. FROM UE's with preserved ROM and strength.   Skin:    General: Skin is warm and dry.     Findings: No rash.  Neurological:     Mental Status: She is alert and oriented to person, place, and time.     ED Results / Procedures / Treatments   Labs (all labs ordered are  listed, but only abnormal results are displayed) Labs Reviewed - No data to display  EKG None  Radiology No results found. DG Ribs Unilateral W/Chest Right  Result Date: 11/17/2019 CLINICAL DATA:  Larey Seat.  Right shoulder pain. EXAM: RIGHT RIBS AND CHEST - 3+ VIEW COMPARISON:  07/04/2017 FINDINGS: Heart size is normal. Mediastinal shadows are normal. The lungs are clear. No pneumothorax or hemothorax. Right rib films do not show a rib fracture. No evidence of clavicle or scapular fracture. Humeral head appears properly located. 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 IMPRESSION: No active cardiopulmonary disease. No traumatic finding. No evidence of skeletal injury. Electronically Signed   By: Paulina Fusi M.D.   On: 11/17/2019 01:18    Procedures Procedures (including critical care time)  Medications Ordered in ED Medications  ibuprofen (ADVIL) tablet 600 mg (has no administration in time range)  oxyCODONE-acetaminophen (PERCOCET/ROXICET) 5-325 MG per tablet 1 tablet (has no administration in time range)    ED Course  I have reviewed the triage vital signs and the nursing notes.  Pertinent labs & imaging results that were available during my care of the patient were reviewed by me and considered in my medical decision making (see chart for details).    MDM Rules/Calculators/A&P                      Patient to ED with complaint of pain after fall 2 days ago as detailed in the HPI.   The patient is well appearing. VSS. Chest pain right sided and reproducible, no c/w ACS. Symptoms and exam c/w musculoskeletal pain s/p fall. No fracture injuries.   Will give Robaxin and ibuprofen for relief. Recommend PCP follow  up if pain continues.   Final Clinical Impression(s) / ED Diagnoses Final diagnoses:  None   1. Fall 2. Musculoskeletal pain  Rx / DC Orders ED Discharge Orders    None       Danne Harbor 11/17/19 0236    Nira Conn, MD 11/17/19 [phone]

## 2020-01-08 ENCOUNTER — Ambulatory Visit: Payer: Self-pay | Attending: Internal Medicine

## 2020-01-08 DIAGNOSIS — Z23 Encounter for immunization: Secondary | ICD-10-CM

## 2020-01-08 NOTE — Progress Notes (Signed)
   Covid-19 Vaccination Clinic  Name:  Lauren Bradford    MRN: [id number] DOB: 02/07/1976  01/08/2020  Lauren Bradford was observed post Covid-19 immunization for 15 minutes without incident. She was provided with Vaccine Information Sheet and instruction to access the V-Safe system.   Lauren Bradford was instructed to call 911 with any severe reactions post vaccine: Marland Kitchen Difficulty breathing  . Swelling of face and throat  . A fast heartbeat  . A bad rash all over body  . Dizziness and weakness   Immunizations Administered    Name Date Dose VIS Date Route   Pfizer COVID-19 Vaccine 01/08/2020 11:39 AM 0.3 mL 09/19/2019 Intramuscular   Manufacturer: [employer], [employer]   Lot: [id number]   NDC: [address]-5

## 2020-02-02 ENCOUNTER — Ambulatory Visit: Payer: Medicare Other | Attending: Internal Medicine

## 2020-02-02 DIAGNOSIS — Z23 Encounter for immunization: Secondary | ICD-10-CM

## 2020-02-02 NOTE — Progress Notes (Signed)
   Covid-19 Vaccination Clinic  Name:  Lauren Bradford    MRN: [id number] DOB: 05/01/1976  02/02/2020  Lauren Bradford was observed post Covid-19 immunization for 15 minutes without incident. She was provided with Vaccine Information Sheet and instruction to access the V-Safe system.   Lauren Bradford was instructed to call 911 with any severe reactions post vaccine: Marland Kitchen Difficulty breathing  . Swelling of face and throat  . A fast heartbeat  . A bad rash all over body  . Dizziness and weakness   Immunizations Administered    Name Date Dose VIS Date Route   Pfizer COVID-19 Vaccine 02/02/2020  9:50 AM 0.3 mL 12/03/2018 Intramuscular   Manufacturer: [employer], [employer]   Lot: [id number]   NDC: [address]-5

## 2020-03-13 ENCOUNTER — Emergency Department (HOSPITAL_COMMUNITY): Payer: Medicare Other

## 2020-03-13 ENCOUNTER — Emergency Department (HOSPITAL_COMMUNITY)
Admission: EM | Admit: 2020-03-13 | Discharge: 2020-03-13 | Disposition: A | Discharge status: Home / Self Care | Payer: Medicare Other | Attending: Emergency Medicine | Admitting: Emergency Medicine

## 2020-03-13 ENCOUNTER — Other Ambulatory Visit: Payer: Self-pay

## 2020-03-13 ENCOUNTER — Encounter (HOSPITAL_COMMUNITY): Payer: Self-pay

## 2020-03-13 DIAGNOSIS — M545 Low back pain, unspecified: Secondary | ICD-10-CM

## 2020-03-13 DIAGNOSIS — J45909 Unspecified asthma, uncomplicated: Secondary | ICD-10-CM | POA: Insufficient documentation

## 2020-03-13 DIAGNOSIS — Z87891 Personal history of nicotine dependence: Secondary | ICD-10-CM | POA: Diagnosis not present

## 2020-03-13 DIAGNOSIS — I10 Essential (primary) hypertension: Secondary | ICD-10-CM | POA: Insufficient documentation

## 2020-03-13 DIAGNOSIS — R1031 Right lower quadrant pain: Secondary | ICD-10-CM | POA: Insufficient documentation

## 2020-03-13 LAB — BASIC METABOLIC PANEL
Anion gap: 11 (ref 5–15)
BUN: 15 mg/dL (ref 6–20)
CO2: 22 mmol/L (ref 22–32)
Calcium: 9.5 mg/dL (ref 8.9–10.3)
Chloride: 104 mmol/L (ref 98–111)
Creatinine, Ser: 0.68 mg/dL (ref 0.44–1.00)
GFR calc Af Amer: >60 mL/min (ref >60)
GFR calc non Af Amer: >60 mL/min (ref >60)
Glucose, Bld: 115 mg/dL — ABNORMAL HIGH (ref 70–99)
Potassium: 3.2 mmol/L — ABNORMAL LOW (ref 3.5–5.1)
Sodium: 137 mmol/L (ref 135–145)

## 2020-03-13 LAB — URINALYSIS, ROUTINE W REFLEX MICROSCOPIC
Bilirubin Urine: NEGATIVE
Glucose, UA: NEGATIVE mg/dL
Hgb urine dipstick: NEGATIVE
Ketones, ur: NEGATIVE mg/dL
Nitrite: NEGATIVE
Protein, ur: NEGATIVE mg/dL
Specific Gravity, Urine: 1.026 (ref 1.005–1.030)
pH: 5 (ref 5.0–8.0)

## 2020-03-13 LAB — CBC
HCT: 39.9 % (ref 36.0–46.0)
Hemoglobin: 13 g/dL (ref 12.0–15.0)
MCH: 29.5 pg (ref 26.0–34.0)
MCHC: 32.6 g/dL (ref 30.0–36.0)
MCV: 90.7 fL (ref 80.0–100.0)
Platelets: 289 10*3/uL (ref 150–400)
RBC: 4.4 MIL/uL (ref 3.87–5.11)
RDW: 13.1 % (ref 11.5–15.5)
WBC: 5.3 10*3/uL (ref 4.0–10.5)
nRBC: 0 % (ref 0.0–0.2)

## 2020-03-13 LAB — LIPASE, BLOOD: Lipase: 45 U/L (ref 11–51)

## 2020-03-13 LAB — HEPATIC FUNCTION PANEL
ALT: 22 U/L (ref 0–44)
AST: 15 U/L (ref 15–41)
Albumin: 3.6 g/dL (ref 3.5–5.0)
Alkaline Phosphatase: 95 U/L (ref 38–126)
Bilirubin, Direct: <0.1 mg/dL (ref 0.0–0.2)
Total Bilirubin: 0.6 mg/dL (ref 0.3–1.2)
Total Protein: 7.4 g/dL (ref 6.5–8.1)

## 2020-03-13 LAB — I-STAT BETA HCG BLOOD, ED (MC, WL, AP ONLY): I-stat hCG, quantitative: 12.5 m[IU]/mL — ABNORMAL HIGH (ref <5)

## 2020-03-13 LAB — HCG, QUANTITATIVE, PREGNANCY: hCG, Beta Chain, Quant, S: 1 m[IU]/mL (ref <5)

## 2020-03-13 MED ORDER — NAPROXEN 500 MG PO TABS
500.0000 mg | ORAL_TABLET | Freq: Two times a day (BID) | ORAL | 0 refills | Status: AC
Start: 2020-03-13 — End: 2020-03-23

## 2020-03-13 MED ORDER — IOHEXOL 300 MG/ML  SOLN
100.0000 mL | Freq: Once | INTRAMUSCULAR | Status: AC | PRN
  Administered 2020-03-13: 100 mL via INTRAVENOUS

## 2020-03-13 MED ORDER — CYCLOBENZAPRINE HCL 10 MG PO TABS
10.0000 mg | ORAL_TABLET | Freq: Two times a day (BID) | ORAL | 0 refills | Status: DC | PRN
Start: 2020-03-13 — End: 2024-03-04

## 2020-03-13 MED ORDER — SODIUM CHLORIDE 0.9% FLUSH
3.0000 mL | Freq: Once | INTRAVENOUS | Status: AC
  Administered 2020-03-13: 3 mL via INTRAVENOUS

## 2020-03-13 NOTE — ED Triage Notes (Signed)
Patient arrived by [facility] for lower back pain radiating to right side and reported syncopal event per family. On arrival alert and orinted, complaining of back pain

## 2020-03-13 NOTE — ED Notes (Signed)
Patient verbalizes understanding of discharge instructions. Opportunity for questioning and answers were provided. Armband removed by staff, pt discharged from ED to home via POV with daughter 

## 2020-03-13 NOTE — ED Provider Notes (Signed)
Medical Decision Making: Care of patient assumed from outgoing provider at  1515.  Agree with history, physical exam and plan.  See their note for further details.  Briefly, 44 y.o. female with:  Chief Complaint  Patient presents with  . Abdominal Pain    Clinical Course as of Mar 14 253  Sat Mar 13, 2020  1402 Patient presenting with back pain and abdominal pain since this morning. Stable and well appearing  on exam but does have TTP of the R flank and RLQ. Given acute onset of her pain with physical activity I do suspect this may just be musculoskeletal back pain. However, given her RLQ tenderness on my exam as well, a CT abdomen and pelvis was ordered to r/o intraabdominal process such as kidney stone or appendicitis. She had an I-stat HCG of 12.5, likely false positive, she has no vaginal or pelvic complaints. CT department requested additional quantitative HCG for confirmation prior to performing CT. Pending that result as well as additional labs at this time   [KM]  1500 Care signed out to oncoming resident due to change of shift.    [KM]  1509 Stable. CC myalgias, Right lower back pain to abdomen and leg. May have had syncope. RLQ tenderness and right flank pain with hx of ovarian cyst. No vaginal complaints. Hepatic panel, Hcg pending then CT scan. Treat for acute back pain.    [AL]    Clinical Course User Index [AL] Janeece Fitting, MD [KM] Ronnie Doss A, PA-C     BP 139/90 (BP Location: Right Arm)   Pulse 75   Temp 98.4 F (36.9 C) (Oral)   Resp 18   Ht 5\' 5"  (1.651 m)   Wt 101.6 kg   SpO2 100%   BMI 37.28 kg/m     ED Course/Procedures     Procedures  MDM   Patient reassessed after CT scan is complete.  No acute critical findings requiring emergency medical intervention.  Patient feeling better and will follow up outpatient with primary care for further evaluation of abdominal pain, CT abnormalities.  Key discharge instructions: You presented to the ED with  right-sided back abdominal pain.  Your CT did not show any acute changes.  No appendicitis.  You do have left adnexal cyst.  Additionally you have an area in your uterus that is nonspecific but highlighted on CT scan with radiology recommending nonemergent outpatient ultrasound.  Most likely you have acute on chronic low back pain.  Please follow-up your primary care physician to schedule a pelvic ultrasound.       , MD 03/14/20 05/14/20    0177, MD 03/14/20 [phone]

## 2020-03-13 NOTE — Discharge Instructions (Addendum)
He presented to the ED with right-sided back abdominal pain.  Your CT did not show any acute changes.  No appendicitis.  You do have left adnexal cyst.  Additionally you have an area in your uterus that is nonspecific but highlighted on CT scan with radiology recommending nonemergent outpatient ultrasound.  Most likely you have acute on chronic low back pain.  Please follow-up your primary care physician to schedule a pelvic ultrasound.

## 2020-03-13 NOTE — ED Provider Notes (Signed)
[facility] EMERGENCY DEPARTMENT Provider Note   CSN: [id number] Arrival date & time: 03/13/20  1129     History No chief complaint on file.   Lauren Bradford is a 44 y.o. female.  Patient is a 44 year old female who has advanced glaucoma, asthma, hypertension presenting to the emergency department for back pain, abdominal pain and a syncopal episode.  Patient reports that last night she began to have pain in her ankles and generalized joint pain.  She reports this morning when she woke up she was sweeping and then began to have a sudden and intense pain in her right lower back radiating down into her leg and also radiating into her right lower quadrant.  Reports that the pain was so severe that she laid face forward on the couch and thinks that she had passed out for a couple seconds.  She does have a history of passing out like this in the past but did not have a history of severe back pain like this.  She denies any shortness of breath or chest pain.  Denies dysuria, hematuria, n/v/d, vaginal bleeding or vaginal discharge. She does report chronic constipation which she feels might be related to a right side ovarian cyst she has had in the past.         Past Medical History:  Diagnosis Date  . Advanced stage glaucoma    limited vision  . Asthma   . Hypertension   . Ovarian cyst ER   right    Patient Active Problem List   Diagnosis Date Noted  . Right ovarian cyst 07/21/2015  . 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 Airway hyperreactivity 03/31/2013  . 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 BP (high blood pressure) 03/31/2013  . 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 Primary open angle glaucoma 10/03/2012    Past Surgical History:  Procedure Laterality Date  . CESAREAN SECTION  2002  . EYE SURGERY     several  . TUBAL LIGATION  2003     OB History    Gravida  3   Para  3   Term  3   Preterm      AB      Living  3      SAB      TAB      Ectopic      Multiple      Live Births  3           Family History  Problem Relation Age of Onset  . Diabetes Mother   . Hyperlipidemia Mother   . Hypertension Mother   . Diabetes Father   . Hypertension Father   . Stroke Father   . Cancer Maternal Grandfather        lung    Social History   Tobacco Use  . Smoking status: Former Games developer  . Smokeless tobacco: Never Used  Substance Use Topics  . Alcohol use: No    Alcohol/week: 0.0 standard drinks  . Drug use: No    Home Medications Prior to Admission medications   Medication Sig Start Date End Date Taking? Authorizing Provider  acetaZOLAMIDE (DIAMOX) 250 MG tablet Take 500 mg by mouth daily.  07/10/15   [provider]  albuterol (PROVENTIL HFA;VENTOLIN HFA) 108 (90 BASE) MCG/ACT inhaler Inhale 2 puffs into the lungs every 6 (six) hours as needed for wheezing or shortness of breath.     [provider]  brimonidine (ALPHAGAN P)  0.1 % SOLN Apply 1 drop to eye 3 (three) times daily.  09/12/14   [provider]  dicyclomine (BENTYL) 20 MG tablet Take 1 tablet (20 mg total) by mouth every 8 (eight) hours as needed for spasms (abdominal spasm/pain). Patient not taking: Reported on 11/17/2019 09/25/19   Raeford Razor, MD  Difluprednate 0.05 % EMUL Place 1 drop into the right eye 2 (two) times daily.  09/21/14   [provider]  fexofenadine (ALLEGRA) 180 MG tablet Take 180 mg by mouth daily as needed for allergies. 05/05/19   [provider]  hydrochlorothiazide (HYDRODIURIL) 25 MG tablet Take 25 mg by mouth daily.  08/13/09   [provider]  ibuprofen (ADVIL) 600 MG tablet Take 1 tablet (600 mg total) by mouth every 6 (six) hours as needed. 11/17/19   Elpidio Anis, PA-C  methocarbamol (ROBAXIN) 750 MG tablet Take 1 tablet (750 mg total) by mouth 2 (two) times daily. 11/17/19   Elpidio Anis, PA-C  potassium chloride SA (KLOR-CON M20) 20 MEQ tablet  Take 1 tablet (20 mEq total) by mouth daily. 09/22/16   Loleta Rose, MD  traMADol (ULTRAM) 50 MG tablet Take 50 mg by mouth 4 (four) times daily as needed for moderate pain.  07/07/19   [provider]  Travoprost, BAK Free, (TRAVATAN) 0.004 % SOLN ophthalmic solution Place 1 drop into both eyes at bedtime.  08/15/19   [provider]    Allergies    Prednisone, Latex, and Yeast-related products  Review of Systems   Review of Systems  Constitutional: Negative for chills and fever.  HENT: Negative.   Eyes: Positive for visual disturbance (chronic).  Respiratory: Negative for cough and shortness of breath.   Cardiovascular: Negative for chest pain.  Gastrointestinal: Positive for abdominal pain and constipation. Negative for diarrhea, nausea and vomiting.  Endocrine: Negative for polyuria.  Genitourinary: Negative for decreased urine volume, dysuria, hematuria, pelvic pain, vaginal bleeding, vaginal discharge and vaginal pain.  Musculoskeletal: Positive for arthralgias, back pain, gait problem and myalgias.  Skin: Negative.   Neurological: Positive for syncope. Negative for dizziness and seizures.  Psychiatric/Behavioral: Negative for confusion.  All other systems reviewed and are negative.   Physical Exam Updated Vital Signs BP (!) 147/94 (BP Location: Right Arm)   Pulse 81   Temp 98.3 F (36.8 C) (Oral)   Resp 16   Ht 5\' 5"  (1.651 m)   Wt 101.6 kg   SpO2 99%   BMI 37.28 kg/m   Physical Exam Vitals and nursing note reviewed.  Constitutional:      General: She is not in acute distress.    Appearance: Normal appearance. She is not ill-appearing, toxic-appearing or diaphoretic.  HENT:     Head: Normocephalic.     Mouth/Throat:     Mouth: Mucous membranes are moist.  Eyes:     Conjunctiva/sclera: Conjunctivae normal.  Cardiovascular:     Rate and Rhythm: Normal rate and regular rhythm.  Pulmonary:     Effort: Pulmonary effort is normal.  Abdominal:      General: Abdomen is flat.     Tenderness: There is abdominal tenderness (RLQ). There is no guarding.  Skin:    General: Skin is warm and dry.  Neurological:     General: No focal deficit present.     Mental Status: She is alert and oriented to person, place, and time.     Cranial Nerves: No cranial nerve deficit.     Sensory: No sensory deficit.  Motor: No weakness.     Deep Tendon Reflexes: Reflexes normal.  Psychiatric:        Mood and Affect: Mood normal.     ED Results / Procedures / Treatments   Labs (all labs ordered are listed, but only abnormal results are displayed) Labs Reviewed  BASIC METABOLIC PANEL - Abnormal; Notable for the following components:      Result Value   Potassium 3.2 (*)    Glucose, Bld 115 (*)    All other components within normal limits  I-STAT BETA HCG BLOOD, ED ([facility], [facility], [facility] ONLY) - Abnormal; Notable for the following components:   I-stat hCG, quantitative 12.5 (*)    All other components within normal limits  CBC  URINALYSIS, ROUTINE W REFLEX MICROSCOPIC  HEPATIC FUNCTION PANEL  LIPASE, BLOOD  CBG MONITORING, ED    EKG None  Radiology No results found.  Procedures Procedures (including critical care time)  Medications Ordered in ED Medications  sodium chloride flush (NS) 0.9 % injection 3 mL (has no administration in time range)    ED Course  I have reviewed the triage vital signs and the nursing notes.  Pertinent labs & imaging results that were available during my care of the patient were reviewed by me and considered in my medical decision making (see chart for details).  Clinical Course as of Mar 14 1527  Sat Mar 13, 2020  1402 Patient presenting with back pain and abdominal pain since this morning. Stable and well appearing  on exam but does have TTP of the R flank and RLQ. Given acute onset of her pain with physical activity I do suspect this may just be musculoskeletal back pain. However, given her RLQ tenderness on my  exam as well, a CT abdomen and pelvis was ordered to r/o intraabdominal process such as kidney stone or appendicitis. She had an I-stat HCG of 12.5, likely false positive, she has no vaginal or pelvic complaints. CT department requested additional quantitative HCG for confirmation prior to performing CT. Pending that result as well as additional labs at this time   [KM]  1500 Care signed out to oncoming resident due to change of shift.    [KM]  1509 Stable. CC myalgias, Right lower back pain to abdomen and leg. May have had syncope. RLQ tenderness and right flank pain with hx of ovarian cyst. No vaginal complaints. Hepatic panel, Hcg pending then CT scan. Treat for acute back pain.    [AL]    Clinical Course User Index [AL] Delma Post, MD [KM] Kristine Royal   MDM Rules/Calculators/A&P                       Final Clinical Impression(s) / ED Diagnoses Final diagnoses:  None    Rx / DC Orders ED Discharge Orders    None       Kristine Royal 03/13/20 1528    Davonna Belling, MD 03/13/20 [phone]

## 2020-04-30 ENCOUNTER — Emergency Department (HOSPITAL_COMMUNITY): Payer: Medicare Other

## 2020-04-30 ENCOUNTER — Emergency Department (HOSPITAL_COMMUNITY)
Admission: EM | Admit: 2020-04-30 | Discharge: 2020-04-30 | Disposition: A | Discharge status: Home / Self Care | Payer: Medicare Other | Attending: Emergency Medicine | Admitting: Emergency Medicine

## 2020-04-30 ENCOUNTER — Encounter (HOSPITAL_COMMUNITY): Payer: Self-pay

## 2020-04-30 ENCOUNTER — Other Ambulatory Visit: Payer: Self-pay

## 2020-04-30 DIAGNOSIS — R55 Syncope and collapse: Secondary | ICD-10-CM | POA: Diagnosis present

## 2020-04-30 DIAGNOSIS — Z79899 Other long term (current) drug therapy: Secondary | ICD-10-CM | POA: Insufficient documentation

## 2020-04-30 DIAGNOSIS — I10 Essential (primary) hypertension: Secondary | ICD-10-CM | POA: Insufficient documentation

## 2020-04-30 DIAGNOSIS — Z91040 Latex allergy status: Secondary | ICD-10-CM | POA: Insufficient documentation

## 2020-04-30 DIAGNOSIS — R0789 Other chest pain: Secondary | ICD-10-CM | POA: Diagnosis not present

## 2020-04-30 DIAGNOSIS — J45909 Unspecified asthma, uncomplicated: Secondary | ICD-10-CM | POA: Insufficient documentation

## 2020-04-30 DIAGNOSIS — Z87891 Personal history of nicotine dependence: Secondary | ICD-10-CM | POA: Insufficient documentation

## 2020-04-30 LAB — BASIC METABOLIC PANEL
Anion gap: 9 (ref 5–15)
BUN: 9 mg/dL (ref 6–20)
CO2: 24 mmol/L (ref 22–32)
Calcium: 9.3 mg/dL (ref 8.9–10.3)
Chloride: 103 mmol/L (ref 98–111)
Creatinine, Ser: 0.75 mg/dL (ref 0.44–1.00)
GFR calc Af Amer: >60 mL/min (ref >60)
GFR calc non Af Amer: >60 mL/min (ref >60)
Glucose, Bld: 120 mg/dL — ABNORMAL HIGH (ref 70–99)
Potassium: 3.5 mmol/L (ref 3.5–5.1)
Sodium: 136 mmol/L (ref 135–145)

## 2020-04-30 LAB — D-DIMER, QUANTITATIVE: D-Dimer, Quant: 0.27 ug/mL-FEU (ref 0.00–0.50)

## 2020-04-30 LAB — CBC WITH DIFFERENTIAL/PLATELET
Abs Immature Granulocytes: 0.01 10*3/uL (ref 0.00–0.07)
Basophils Absolute: 0 10*3/uL (ref 0.0–0.1)
Basophils Relative: 1 %
Eosinophils Absolute: 0.1 10*3/uL (ref 0.0–0.5)
Eosinophils Relative: 3 %
HCT: 38.7 % (ref 36.0–46.0)
Hemoglobin: 12.4 g/dL (ref 12.0–15.0)
Immature Granulocytes: 0 %
Lymphocytes Relative: 29 %
Lymphs Abs: 1.5 10*3/uL (ref 0.7–4.0)
MCH: 29 pg (ref 26.0–34.0)
MCHC: 32 g/dL (ref 30.0–36.0)
MCV: 90.6 fL (ref 80.0–100.0)
Monocytes Absolute: 0.4 10*3/uL (ref 0.1–1.0)
Monocytes Relative: 7 %
Neutro Abs: 3.2 10*3/uL (ref 1.7–7.7)
Neutrophils Relative %: 60 %
Platelets: 214 10*3/uL (ref 150–400)
RBC: 4.27 MIL/uL (ref 3.87–5.11)
RDW: 13.2 % (ref 11.5–15.5)
WBC: 5.1 10*3/uL (ref 4.0–10.5)
nRBC: 0 % (ref 0.0–0.2)

## 2020-04-30 LAB — TROPONIN I (HIGH SENSITIVITY)
Troponin I (High Sensitivity): <2 ng/L (ref <18)
Troponin I (High Sensitivity): <2 ng/L (ref <18)

## 2020-04-30 NOTE — ED Triage Notes (Signed)
Pt BIB [facility] from [facility], pt felt SOB, CP, went outside for air, had unwitnessed syncopal event in elevator. Reporting neck and chest pain from fall, c-collar in place. Pt alert to pain on EMS arrival, A&Ox3 now, no memory of event. Pt NSR, received 324 ASA, Hx HTN, DM, blind. VSS.

## 2020-04-30 NOTE — ED Notes (Signed)
Pt states chest pain and lightheadedness while obtaining orthostatic vitals.

## 2020-04-30 NOTE — Discharge Instructions (Signed)
Thank you for allowing me to care for you today. Please return to the emergency department if you have new or worsening symptoms. Take your medications as instructed.  ° °

## 2020-04-30 NOTE — ED Provider Notes (Signed)
[facility] EMERGENCY DEPARTMENT Provider Note   CSN: [id number] Arrival date & time: 04/30/20  1110     History Chief Complaint  Patient presents with  . Loss of Consciousness  . Chest Pain    Lauren Bradford is a 44 y.o. female.  Patient is a 44 year old female with past medical history of diabetes, hypertension, blindness presenting to the emergency department for chest pain and syncopal episode.  Patient reports that she started to have chest pain last night after an argument with her children.  Reports that it feels like stress.  Reports that she was at work today and was feeling palpitations and short of breath with her chest pain so she decided to go out to get some air.  She reports the last thing that she remembers is stepping into the elevator and pressing the one button.  She supposedly had an unwitnessed syncopal event.  Reporting head pain and neck pain from the fall.  She is now alert and oriented x4.  She reports that the pain feels like it is stabbing in her chest into her back.  The pain is improved now with rest but still present.  Given aspirin via EMS.  Denies any leg swelling, numbness, tingling, weakness.        Past Medical History:  Diagnosis Date  . Advanced stage glaucoma    limited vision  . Asthma   . Hypertension   . Ovarian cyst ER   right    Patient Active Problem List   Diagnosis Date Noted  . Right ovarian cyst 07/21/2015  . 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 Airway hyperreactivity 03/31/2013  . 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 BP (high blood pressure) 03/31/2013  . 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 Primary open angle glaucoma 10/03/2012    Past Surgical History:  Procedure Laterality Date  . CESAREAN SECTION  2002  . EYE SURGERY     several  . TUBAL LIGATION  2003     OB History    Gravida  3   Para  3   Term  3   Preterm      AB      Living  3     SAB       TAB      Ectopic      Multiple      Live Births  3           Family History  Problem Relation Age of Onset  . Diabetes Mother   . Hyperlipidemia Mother   . Hypertension Mother   . Diabetes Father   . Hypertension Father   . Stroke Father   . Cancer Maternal Grandfather        lung    Social History   Tobacco Use  . Smoking status: Former Games developer  . Smokeless tobacco: Never Used  Vaping Use  . Vaping Use: Never used  Substance Use Topics  . Alcohol use: No    Alcohol/week: 0.0 standard drinks  . Drug use: No    Home Medications Prior to Admission medications   Medication Sig Start Date End Date Taking? Authorizing Provider  acetaZOLAMIDE (DIAMOX) 250 MG tablet Take 500 mg by mouth daily.  07/10/15   [provider]  albuterol (PROVENTIL HFA;VENTOLIN HFA) 108 (90 BASE) MCG/ACT inhaler Inhale 2 puffs into the lungs every 6 (six) hours as needed for wheezing or shortness of breath.     [provider]  brimonidine (ALPHAGAN P) 0.1 % SOLN Apply 1 drop to eye 3 (three) times daily.  09/12/14   [provider]  cyclobenzaprine (FLEXERIL) 10 MG tablet Take 1 tablet (10 mg total) by mouth 2 (two) times daily as needed for muscle spasms. 03/13/20   Janeece Fitting, MD  dicyclomine (BENTYL) 20 MG tablet Take 1 tablet (20 mg total) by mouth every 8 (eight) hours as needed for spasms (abdominal spasm/pain). Patient not taking: Reported on 11/17/2019 09/25/19   Raeford Razor, MD  Difluprednate 0.05 % EMUL Place 1 drop into the right eye 2 (two) times daily.  09/21/14   [provider]  fexofenadine (ALLEGRA) 180 MG tablet Take 180 mg by mouth daily as needed for allergies. 05/05/19   [provider]  hydrochlorothiazide (HYDRODIURIL) 25 MG tablet Take 25 mg by mouth daily.  08/13/09   [provider]  ibuprofen (ADVIL) 600 MG tablet Take 1 tablet (600 mg total) by mouth every 6 (six) hours as needed. 11/17/19   Elpidio Anis, PA-C   methocarbamol (ROBAXIN) 750 MG tablet Take 1 tablet (750 mg total) by mouth 2 (two) times daily. 11/17/19   Elpidio Anis, PA-C  potassium chloride SA (KLOR-CON M20) 20 MEQ tablet Take 1 tablet (20 mEq total) by mouth daily. 09/22/16   Loleta Rose, MD  traMADol (ULTRAM) 50 MG tablet Take 50 mg by mouth 4 (four) times daily as needed for moderate pain.  07/07/19   [provider]  Travoprost, BAK Free, (TRAVATAN) 0.004 % SOLN ophthalmic solution Place 1 drop into both eyes at bedtime.  08/15/19   [provider]    Allergies    Prednisone, Latex, and Yeast-related products  Review of Systems   Review of Systems  Constitutional: Negative for appetite change, chills, fatigue and fever.  HENT: Negative.   Respiratory: Positive for shortness of breath. Negative for cough.   Cardiovascular: Positive for chest pain and palpitations. Negative for leg swelling.  Gastrointestinal: Negative for abdominal pain, nausea and vomiting.  Genitourinary: Negative for dysuria.  Musculoskeletal: Positive for neck pain.  Skin: Negative for rash and wound.  Neurological: Positive for syncope and headaches. Negative for dizziness.  All other systems reviewed and are negative.   Physical Exam Updated Vital Signs BP (!) 127/94   Pulse 85   Temp 98 F (36.7 C) (Oral)   Resp 16   Ht 5\' 5"  (1.651 m)   Wt (!) 104.8 kg   SpO2 98%   BMI 38.44 kg/m   Physical Exam Vitals and nursing note reviewed.  Constitutional:      General: She is not in acute distress.    Appearance: Normal appearance. She is well-developed. She is obese. She is not ill-appearing, toxic-appearing or diaphoretic.  HENT:     Head: Normocephalic and atraumatic. No raccoon eyes, Battle's sign, abrasion, contusion, masses or laceration.     Jaw: There is normal jaw occlusion.     Nose: Nose normal.     Mouth/Throat:     Mouth: Mucous membranes are moist.  Eyes:     Conjunctiva/sclera: Conjunctivae normal.  Neck:      Trachea: Trachea normal.     Comments: In ccollar Cardiovascular:     Rate and Rhythm: Normal rate and regular rhythm.  Pulmonary:     Effort: Pulmonary effort is normal. No accessory muscle usage or prolonged expiration.     Breath sounds: Normal breath sounds. No decreased air movement.  Chest:  Chest wall: Tenderness present.     Comments: Diffuse tenderness to entire anterior chest wall, no deformity, crepitus, bruising or swelling Abdominal:     General: Abdomen is flat. There are no signs of injury.     Palpations: Abdomen is soft.     Tenderness: There is no abdominal tenderness. There is no guarding or rebound.  Musculoskeletal:     Cervical back: Full passive range of motion without pain.     Thoracic back: Normal.     Lumbar back: Normal.  Skin:    General: Skin is warm and dry.     Capillary Refill: Capillary refill takes less than 2 seconds.  Neurological:     General: No focal deficit present.     Mental Status: She is alert and oriented to person, place, and time.     Cranial Nerves: No cranial nerve deficit.     Motor: No weakness.  Psychiatric:        Mood and Affect: Mood normal.        Behavior: Behavior normal.     ED Results / Procedures / Treatments   Labs (all labs ordered are listed, but only abnormal results are displayed) Labs Reviewed  BASIC METABOLIC PANEL - Abnormal; Notable for the following components:      Result Value   Glucose, Bld 120 (*)    All other components within normal limits  CBC WITH DIFFERENTIAL/PLATELET  D-DIMER, QUANTITATIVE (NOT AT [facility])  TROPONIN I (HIGH SENSITIVITY)  TROPONIN I (HIGH SENSITIVITY)    EKG EKG Interpretation  Date/Time:  Friday April 30 2020 11:28:32 EDT Ventricular Rate:  79 PR Interval:  174 QRS Duration: 90 QT Interval:  416 QTC Calculation: 477 R Axis:   -20 Text Interpretation: Normal sinus rhythm Normal ECG since last tracing no significant change Confirmed by Mancel Bale [phone]) on  04/30/2020 11:39:26 AM   Radiology CT Head Wo Contrast  Result Date: 04/30/2020 CLINICAL DATA:  Posterior headache following a syncopal episode and fall today. EXAM: CT HEAD WITHOUT CONTRAST CT CERVICAL SPINE WITHOUT CONTRAST TECHNIQUE: Multidetector CT imaging of the head and cervical spine was performed following the standard protocol without intravenous contrast. Multiplanar CT image reconstructions of the cervical spine were also generated. COMPARISON:  Head CT dated 09/22/2016. FINDINGS: CT HEAD FINDINGS Brain: Normal appearing cerebral hemispheres and posterior fossa structures. Normal size and position of the ventricles. No intracranial hemorrhage, mass lesion or CT evidence of acute infarction. Vascular: No hyperdense vessel or unexpected calcification. Skull: Normal. Negative for fracture or focal lesion. Sinuses/Orbits: Stable scleral buckles. Status post bilateral cataract extraction. Unremarkable bones and included paranasal sinuses. Other: None. CT CERVICAL SPINE FINDINGS Alignment: Reversal the normal cervical lordosis.  No subluxation. Skull base and vertebrae: No acute fracture. No primary bone lesion or focal pathologic process. Soft tissues and spinal canal: No prevertebral fluid or swelling. No visible canal hematoma. Disc levels:  Unremarkable. Upper chest: Clear lung apices. Other: None. IMPRESSION: 1. Normal head CT. 2. No cervical spine fracture or subluxation. 3. Reversal of the normal cervical lordosis. Electronically Signed   By: Beckie Salts M.D.   On: 04/30/2020 13:21   CT Cervical Spine Wo Contrast  Result Date: 04/30/2020 CLINICAL DATA:  Posterior headache following a syncopal episode and fall today. EXAM: CT HEAD WITHOUT CONTRAST CT CERVICAL SPINE WITHOUT CONTRAST TECHNIQUE: Multidetector CT imaging of the head and cervical spine was performed following the standard protocol without intravenous contrast. Multiplanar CT image reconstructions of the cervical spine  were also  generated. COMPARISON:  Head CT dated 09/22/2016. FINDINGS: CT HEAD FINDINGS Brain: Normal appearing cerebral hemispheres and posterior fossa structures. Normal size and position of the ventricles. No intracranial hemorrhage, mass lesion or CT evidence of acute infarction. Vascular: No hyperdense vessel or unexpected calcification. Skull: Normal. Negative for fracture or focal lesion. Sinuses/Orbits: Stable scleral buckles. Status post bilateral cataract extraction. Unremarkable bones and included paranasal sinuses. Other: None. CT CERVICAL SPINE FINDINGS Alignment: Reversal the normal cervical lordosis.  No subluxation. Skull base and vertebrae: No acute fracture. No primary bone lesion or focal pathologic process. Soft tissues and spinal canal: No prevertebral fluid or swelling. No visible canal hematoma. Disc levels:  Unremarkable. Upper chest: Clear lung apices. Other: None. IMPRESSION: 1. Normal head CT. 2. No cervical spine fracture or subluxation. 3. Reversal of the normal cervical lordosis. Electronically Signed   By: Beckie SaltsSteven  Reid M.D.   On: 04/30/2020 13:21   DG Chest Portable 1 View  Result Date: 04/30/2020 CLINICAL DATA:  Acute chest pain. EXAM: PORTABLE CHEST 1 VIEW COMPARISON:  11/17/2019 FINDINGS: This is a low volume study. The cardiomediastinal silhouette is unremarkable. There is no evidence of focal airspace disease, pulmonary edema, suspicious pulmonary nodule/mass, pleural effusion, or pneumothorax. No acute bony abnormalities are identified. IMPRESSION: No active disease. Electronically Signed   By: Harmon PierJeffrey  Hu M.D.   On: 04/30/2020 12:06    Procedures Procedures (including critical care time)  Medications Ordered in ED Medications - No data to display  ED Course  I have reviewed the triage vital signs and the nursing notes.  Pertinent labs & imaging results that were available during my care of the patient were reviewed by me and considered in my medical decision making (see  chart for details).  Clinical Course as of Apr 30 1705  Fri Apr 30, 2020  1442 Patient presenting with chest pain after verbal altercation with her children last night followed by unwitnessed syncopal episode at work. Patient reports anxiety and has diffuse reproducible chest pain on exam. Initial workup reveals normal trop, ddimer, CBC and BMP. No events on cardiac monitor. Will obtain delta trop, orthostatics and reassess. She reports she is feeling much better   [KM]  1701 Patient workup reassuring. Patient reports she is improved. Etiology of symptoms may be related to stress given that she endorses such and endorses this all starting after an argument with her children. She will be sent home with good return precautions.    [KM]    Clinical Course User Index [KM] Jeral PinchMcLean, Durelle Zepeda A, PA-C   MDM Rules/Calculators/A&P                          Based on review of vitals, medical screening exam, lab work and/or imaging, there does not appear to be an acute, emergent etiology for the patient's symptoms. Counseled pt on good return precautions and encouraged both PCP and ED follow-up as needed.  Prior to discharge, I also discussed incidental imaging findings with patient in detail and advised appropriate, recommended follow-up in detail.  Clinical Impression: 1. Syncope and collapse   2. Atypical chest pain     Disposition: Discharge  Prior to providing a prescription for a controlled substance, I independently reviewed the patient's recent prescription history on the [place] Cont[place] Sub[place]. The patient had no recent or regular prescriptions and was deemed appropriate for a brief, less than 3 day prescription of narcotic for acute analgesia.  This note was prepared with assistance of Conservation officer, historic buildings. Occasional wrong-word or sound-a-like substitutions may have occurred due to the inherent limitations of voice recognition software.  Final  Clinical Impression(s) / ED Diagnoses Final diagnoses:  Syncope and collapse  Atypical chest pain    Rx / DC Orders ED Discharge Orders    None       Jeral Pinch 04/30/20 1706    Mancel Bale, MD 04/30/20 2055

## 2020-04-30 NOTE — ED Notes (Signed)
Patient verbalizes understanding of discharge instructions. Opportunity for questioning and answers were provided. Armband removed by staff, pt discharged from ED via wheelchair.  

## 2020-06-15 ENCOUNTER — Encounter (HOSPITAL_COMMUNITY): Payer: Self-pay

## 2020-06-15 ENCOUNTER — Emergency Department (HOSPITAL_COMMUNITY)
Admission: EM | Admit: 2020-06-15 | Discharge: 2020-06-16 | Disposition: A | Discharge status: Home / Self Care | Payer: Medicare Other | Attending: Emergency Medicine | Admitting: Emergency Medicine

## 2020-06-15 ENCOUNTER — Emergency Department (HOSPITAL_COMMUNITY): Payer: Medicare Other

## 2020-06-15 DIAGNOSIS — Z91040 Latex allergy status: Secondary | ICD-10-CM | POA: Insufficient documentation

## 2020-06-15 DIAGNOSIS — R0602 Shortness of breath: Secondary | ICD-10-CM | POA: Diagnosis not present

## 2020-06-15 DIAGNOSIS — Z79899 Other long term (current) drug therapy: Secondary | ICD-10-CM | POA: Insufficient documentation

## 2020-06-15 DIAGNOSIS — Z7951 Long term (current) use of inhaled steroids: Secondary | ICD-10-CM | POA: Diagnosis not present

## 2020-06-15 DIAGNOSIS — I10 Essential (primary) hypertension: Secondary | ICD-10-CM | POA: Diagnosis not present

## 2020-06-15 DIAGNOSIS — R1012 Left upper quadrant pain: Secondary | ICD-10-CM | POA: Insufficient documentation

## 2020-06-15 DIAGNOSIS — R079 Chest pain, unspecified: Secondary | ICD-10-CM | POA: Diagnosis not present

## 2020-06-15 DIAGNOSIS — J45909 Unspecified asthma, uncomplicated: Secondary | ICD-10-CM | POA: Diagnosis not present

## 2020-06-15 DIAGNOSIS — K219 Gastro-esophageal reflux disease without esophagitis: Secondary | ICD-10-CM

## 2020-06-15 LAB — COMPREHENSIVE METABOLIC PANEL
ALT: 15 U/L (ref 0–44)
AST: 13 U/L — ABNORMAL LOW (ref 15–41)
Albumin: 4.2 g/dL (ref 3.5–5.0)
Alkaline Phosphatase: 109 U/L (ref 38–126)
Anion gap: 13 (ref 5–15)
BUN: 16 mg/dL (ref 6–20)
CO2: 24 mmol/L (ref 22–32)
Calcium: 10 mg/dL (ref 8.9–10.3)
Chloride: 98 mmol/L (ref 98–111)
Creatinine, Ser: 0.81 mg/dL (ref 0.44–1.00)
GFR calc Af Amer: >60 mL/min (ref >60)
GFR calc non Af Amer: >60 mL/min (ref >60)
Glucose, Bld: 219 mg/dL — ABNORMAL HIGH (ref 70–99)
Potassium: 3.1 mmol/L — ABNORMAL LOW (ref 3.5–5.1)
Sodium: 135 mmol/L (ref 135–145)
Total Bilirubin: 0.6 mg/dL (ref 0.3–1.2)
Total Protein: 8.2 g/dL — ABNORMAL HIGH (ref 6.5–8.1)

## 2020-06-15 LAB — TROPONIN I (HIGH SENSITIVITY)
Troponin I (High Sensitivity): 3 ng/L (ref <18)
Troponin I (High Sensitivity): <2 ng/L (ref <18)

## 2020-06-15 LAB — CBC
HCT: 42.7 % (ref 36.0–46.0)
Hemoglobin: 13.7 g/dL (ref 12.0–15.0)
MCH: 29.2 pg (ref 26.0–34.0)
MCHC: 32.1 g/dL (ref 30.0–36.0)
MCV: 91 fL (ref 80.0–100.0)
Platelets: 298 10*3/uL (ref 150–400)
RBC: 4.69 MIL/uL (ref 3.87–5.11)
RDW: 13.3 % (ref 11.5–15.5)
WBC: 4.1 10*3/uL (ref 4.0–10.5)
nRBC: 0 % (ref 0.0–0.2)

## 2020-06-15 LAB — I-STAT BETA HCG BLOOD, ED (MC, WL, AP ONLY): I-stat hCG, quantitative: <5 m[IU]/mL (ref <5)

## 2020-06-15 LAB — LIPASE, BLOOD: Lipase: 32 U/L (ref 11–51)

## 2020-06-15 NOTE — ED Triage Notes (Signed)
Pt reports chest pain, sob and LLQ pain, some nausea, no vomiting. Pt took 2 aspirin this morning without relief. Pt a.o, nad noted

## 2020-06-16 ENCOUNTER — Encounter (HOSPITAL_COMMUNITY): Payer: Self-pay | Admitting: Emergency Medicine

## 2020-06-16 DIAGNOSIS — R1012 Left upper quadrant pain: Secondary | ICD-10-CM | POA: Diagnosis not present

## 2020-06-16 MED ORDER — LIDOCAINE VISCOUS HCL 2 % MT SOLN
15.0000 mL | Freq: Once | OROMUCOSAL | Status: AC
  Administered 2020-06-16: 15 mL via ORAL
  Filled 2020-06-16: qty 15

## 2020-06-16 MED ORDER — OMEPRAZOLE 20 MG PO CPDR
20.0000 mg | DELAYED_RELEASE_CAPSULE | Freq: Every day | ORAL | 0 refills | Status: DC
Start: 2020-06-16 — End: 2023-08-12

## 2020-06-16 MED ORDER — ALUM & MAG HYDROXIDE-SIMETH 200-200-20 MG/5ML PO SUSP
30.0000 mL | Freq: Once | ORAL | Status: AC
  Administered 2020-06-16: 30 mL via ORAL
  Filled 2020-06-16: qty 30

## 2020-06-16 NOTE — ED Provider Notes (Signed)
[facility] EMERGENCY DEPARTMENT Provider Note   CSN: [id number] Arrival date & time: 06/15/20  1659     History Chief Complaint  Patient presents with  . Chest Pain  . Shortness of Breath  . Abdominal Pain    Lauren Bradford is a 44 y.o. female.  The history is provided by the patient.  Chest Pain Chest pain location: LUQ. Pain quality: not burning   Pain quality comment:  Spasmotic  Radiates to: upper chest  Pain severity:  Moderate Onset quality:  Gradual Duration:  6 days Timing:  Constant Progression:  Unchanged Chronicity:  New Context: eating   Context comment:  Worse when she eats, chicken and meats in particular  Relieved by:  Nothing Worsened by:  Nothing Ineffective treatments:  None tried Associated symptoms: no anorexia, no back pain, no cough, no diaphoresis, no lower extremity edema, no palpitations and no shortness of breath   Shortness of Breath Associated symptoms: chest pain   Associated symptoms: no cough and no diaphoresis   Abdominal Pain Associated symptoms: chest pain   Associated symptoms: no anorexia, no cough and no shortness of breath        Past Medical History:  Diagnosis Date  . Advanced stage glaucoma    limited vision  . Asthma   . Hypertension   . Ovarian cyst ER   right    Patient Active Problem List   Diagnosis Date Noted  . Right ovarian cyst 07/21/2015  . 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 Airway hyperreactivity 03/31/2013  . 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 BP (high blood pressure) 03/31/2013  . 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 Primary open angle glaucoma 10/03/2012    Past Surgical History:  Procedure Laterality Date  . CESAREAN SECTION  2002  . EYE SURGERY     several  . TUBAL LIGATION  2003     OB History    Gravida  3   Para  3   Term  3   Preterm      AB      Living  3     SAB      TAB      Ectopic      Multiple      Live  Births  3           Family History  Problem Relation Age of Onset  . Diabetes Mother   . Hyperlipidemia Mother   . Hypertension Mother   . Diabetes Father   . Hypertension Father   . Stroke Father   . Cancer Maternal Grandfather        lung    Social History   Tobacco Use  . Smoking status: Former Games developer  . Smokeless tobacco: Never Used  Vaping Use  . Vaping Use: Never used  Substance Use Topics  . Alcohol use: No    Alcohol/week: 0.0 standard drinks  . Drug use: No    Home Medications Prior to Admission medications   Medication Sig Start Date End Date Taking? Authorizing Provider  acetaZOLAMIDE (DIAMOX) 250 MG tablet Take 500 mg by mouth daily.  07/10/15   [provider]  albuterol (PROVENTIL HFA;VENTOLIN HFA) 108 (90 BASE) MCG/ACT inhaler Inhale 2 puffs into the lungs every 6 (six) hours as needed for wheezing or shortness of breath.     [provider]  brimonidine (ALPHAGAN P) 0.1 % SOLN Apply 1 drop to eye 3 (three) times daily.  09/12/14   [provider]  cyclobenzaprine (FLEXERIL) 10 MG tablet Take 1 tablet (10 mg total) by mouth 2 (two) times daily as needed for muscle spasms. 03/13/20   Janeece Fitting, MD  dicyclomine (BENTYL) 20 MG tablet Take 1 tablet (20 mg total) by mouth every 8 (eight) hours as needed for spasms (abdominal spasm/pain). Patient not taking: Reported on 11/17/2019 09/25/19   Raeford Razor, MD  Difluprednate 0.05 % EMUL Place 1 drop into the right eye 2 (two) times daily.  09/21/14   [provider]  fexofenadine (ALLEGRA) 180 MG tablet Take 180 mg by mouth daily as needed for allergies. 05/05/19   [provider]  hydrochlorothiazide (HYDRODIURIL) 25 MG tablet Take 25 mg by mouth daily.  08/13/09   [provider]  ibuprofen (ADVIL) 600 MG tablet Take 1 tablet (600 mg total) by mouth every 6 (six) hours as needed. 11/17/19   Elpidio Anis, PA-C  methocarbamol (ROBAXIN) 750 MG tablet Take 1 tablet  (750 mg total) by mouth 2 (two) times daily. 11/17/19   Elpidio Anis, PA-C  potassium chloride SA (KLOR-CON M20) 20 MEQ tablet Take 1 tablet (20 mEq total) by mouth daily. 09/22/16   Loleta Rose, MD  traMADol (ULTRAM) 50 MG tablet Take 50 mg by mouth 4 (four) times daily as needed for moderate pain.  07/07/19   [provider]  Travoprost, BAK Free, (TRAVATAN) 0.004 % SOLN ophthalmic solution Place 1 drop into both eyes at bedtime.  08/15/19   [provider]    Allergies    Prednisone, Latex, and Yeast-related products  Review of Systems   Review of Systems  Constitutional: Negative for diaphoresis.  Respiratory: Negative for cough and shortness of breath.   Cardiovascular: Positive for chest pain. Negative for palpitations.  Gastrointestinal: Negative for anorexia.  Musculoskeletal: Negative for back pain.    Physical Exam Updated Vital Signs BP 136/89 (BP Location: Right Arm)   Pulse 91   Temp 97.9 F (36.6 C) (Oral)   Resp 18   SpO2 96%   Physical Exam  ED Results / Procedures / Treatments   Labs (all labs ordered are listed, but only abnormal results are displayed) Results for orders placed or performed during the hospital encounter of 06/15/20  CBC  Result Value Ref Range   WBC 4.1 4.0 - 10.5 K/uL   RBC 4.69 3.87 - 5.11 MIL/uL   Hemoglobin 13.7 12.0 - 15.0 g/dL   HCT 68.1 36 - 46 %   MCV 91.0 80.0 - 100.0 fL   MCH 29.2 26.0 - 34.0 pg   MCHC 32.1 30.0 - 36.0 g/dL   RDW 27.5 17.0 - 01.7 %   Platelets 298 150 - 400 K/uL   nRBC 0.0 0.0 - 0.2 %  Comprehensive metabolic panel  Result Value Ref Range   Sodium 135 135 - 145 mmol/L   Potassium 3.1 (L) 3.5 - 5.1 mmol/L   Chloride 98 98 - 111 mmol/L   CO2 24 22 - 32 mmol/L   Glucose, Bld 219 (H) 70 - 99 mg/dL   BUN 16 6 - 20 mg/dL   Creatinine, Ser 4.94 0.44 - 1.00 mg/dL   Calcium 49.6 8.9 - 75.9 mg/dL   Total Protein 8.2 (H) 6.5 - 8.1 g/dL   Albumin 4.2 3.5 - 5.0 g/dL   AST 13 (L) 15 - 41 U/L    ALT 15 0 - 44 U/L   Alkaline Phosphatase 109 38 - 126 U/L  Total Bilirubin 0.6 0.3 - 1.2 mg/dL   GFR calc non Af Amer >60 >60 mL/min   GFR calc Af Amer >60 >60 mL/min   Anion gap 13 5 - 15  Lipase, blood  Result Value Ref Range   Lipase 32 11 - 51 U/L  I-Stat beta hCG blood, ED  Result Value Ref Range   I-stat hCG, quantitative <5.0 <5 mIU/mL   Comment 3          Troponin I (High Sensitivity)  Result Value Ref Range   Troponin I (High Sensitivity) 3 <18 ng/L  Troponin I (High Sensitivity)  Result Value Ref Range   Troponin I (High Sensitivity) <2 <18 ng/L   DG Chest 2 View  Result Date: 06/15/2020 CLINICAL DATA:  Chest pain EXAM: CHEST - 2 VIEW COMPARISON:  04/30/2020 FINDINGS: The heart size and mediastinal contours are within normal limits. Both lungs are clear. The visualized skeletal structures are unremarkable. IMPRESSION: No active cardiopulmonary disease. Electronically Signed   By: Sharlet SalinaMichael  Brown M.D.   On: 06/15/2020 17:44    EKG EKG Interpretation  Date/Time:  Tuesday June 15 2020 17:05:33 EDT Ventricular Rate:  94 PR Interval:  154 QRS Duration: 78 QT Interval:  348 QTC Calculation: 435 R Axis:   8 Text Interpretation: Normal sinus rhythm \ Confirmed by Yuvonne Lanahan ([id number]) on 06/16/2020 5:08:18 AM   Radiology DG Chest 2 View  Result Date: 06/15/2020 CLINICAL DATA:  Chest pain EXAM: CHEST - 2 VIEW COMPARISON:  04/30/2020 FINDINGS: The heart size and mediastinal contours are within normal limits. Both lungs are clear. The visualized skeletal structures are unremarkable. IMPRESSION: No active cardiopulmonary disease. Electronically Signed   By: Sharlet SalinaMichael  Brown M.D.   On: 06/15/2020 17:44    Procedures Procedures (including critical care time)  Medications Ordered in ED Medications  alum & mag hydroxide-simeth (MAALOX/MYLANTA) 200-200-20 MG/5ML suspension 30 mL (has no administration in time range)    And  lidocaine (XYLOCAINE) 2 % viscous mouth solution  15 mL (has no administration in time range)    ED Course  I have reviewed the triage vital signs and the nursing notes.  Pertinent labs & imaging results that were available during my care of the patient were reviewed by me and considered in my medical decision making (see chart for details).    PERC negative wells 0 highly doubt PE in this patient. Patient ruled out for MI with a negative EKG and 2 negative troponins.  HEART score is 2 low risk for MACE.  Symptoms are highly atypical for cardiac etiology and most consistent with GERD and are ongoing at this time, given this if it were cardiac there would be an appreciable increase in troponin and there is not.  Exam is benign and reassuring and there is no indication for advanced imaging at this time.  We will start PPI and a gerd friendly diet and refer to GI as an outpatient.    Lauren Bradford was evaluated in Emergency Department on 06/16/2020 for the symptoms described in the history of present illness. She was evaluated in the context of the global COVID-19 pandemic, which necessitated consideration that the patient might be at risk for infection with the SARS-CoV-2 virus that causes COVID-19. Institutional protocols and algorithms that pertain to the evaluation of patients at risk for COVID-19 are in a state of rapid change based on information released by regulatory bodies including the CDC and federal and state organizations. These policies and algorithms were  followed during the patient's care in the ED.  Final Clinical Impression(s) / ED Diagnoses Return for intractable cough, coughing up blood,fevers >100.4 unrelieved by medication, shortness of breath, intractable vomiting, chest pain, shortness of breath, weakness,numbness, changes in speech, facial asymmetry,abdominal pain, passing out,Inability to tolerate liquids or food, cough, altered mental status or any concerns. No signs of systemic illness or infection. The patient is  nontoxic-appearing on exam and vital signs are within normal limits.   I have reviewed the triage vital signs and the nursing notes. Pertinent labs &imaging results that were available during my care of the patient were reviewed by me and considered in my medical decision making (see chart for details).After history, exam, and medical workup I feel the patient has beenappropriately medically screened and is safe for discharge home. Pertinent diagnoses were discussed with the patient. Patient was given return precautions.   Shamarie Call, MD 06/16/20 [phone]

## 2020-06-16 NOTE — ED Notes (Signed)
While this tech was obtaining pts vitals. Tech asked son to wait outside because there are no visitors to be in the lobby. Son stated that during the day they were just handing out green wrist bands. Pt stated that she is waiting outside and wants someone to come get her when her name is called. Pt was cussing at this tech. Pt stated that if her son has to wait outside then she is too. This tech told pt that we do not advise that. Security escorted son out of waiting room. Pt followed.

## 2020-10-07 ENCOUNTER — Encounter: Payer: Self-pay | Admitting: Gastroenterology

## 2020-11-02 ENCOUNTER — Ambulatory Visit: Payer: Medicare Other | Admitting: Gastroenterology

## 2020-12-08 ENCOUNTER — Ambulatory Visit (INDEPENDENT_AMBULATORY_CARE_PROVIDER_SITE_OTHER): Payer: Medicare (Managed Care) | Admitting: Gastroenterology

## 2020-12-08 ENCOUNTER — Other Ambulatory Visit: Payer: Self-pay

## 2020-12-08 ENCOUNTER — Encounter: Payer: Self-pay | Admitting: Gastroenterology

## 2020-12-08 VITALS — BP 120/66 | HR 80 | Ht 65.0 in | Wt 235.0 lb

## 2020-12-08 DIAGNOSIS — R1030 Lower abdominal pain, unspecified: Secondary | ICD-10-CM | POA: Diagnosis not present

## 2020-12-08 DIAGNOSIS — K5900 Constipation, unspecified: Secondary | ICD-10-CM | POA: Diagnosis not present

## 2020-12-08 NOTE — Patient Instructions (Addendum)
Please start over the counter Miralax daily.   We have scheduled you for a pre-visit and your colonoscopy. Please have a family member come with you to your nurse visit on 01/03/21.   Thank you for choosing me and [facility].  Venita Lick. Pleas Koch., MD., Clementeen Graham

## 2020-12-08 NOTE — Progress Notes (Signed)
History of Present Illness: This is a 45 year old female referred by Leilani Able, MD for the evaluation of abdominal pain and constipation.  She relates pain across her lower abdomen and constipation for about 1 year.  Her symptoms were very active several months ago.  She made substantial dietary changes with higher fiber foods and more water daily.  She increased her exercise. She is now having regular bowel movements with only rare episodes of lower abdominal pain.  She states dicyclomine is somewhat effective for reducing symptoms when pain occurs. CBC, LFTs, lipase were normal in September 2021.  Denies weight loss, diarrhea, change in stool caliber, melena, hematochezia, nausea, vomiting, dysphagia, reflux symptoms, chest pain.   CT AP 03/13/2020 1. Noninflamed sigmoid diverticulosis. 2. Left adnexal cysts, likely ovarian in origin. 3. Moderate severity calcification of the abdominal aorta. 4. Mildly enlarged and heterogeneous appearing lower uterine segment. Correlation with nonemergent pelvic ultrasound is recommended to exclude the presence of an underlying uterine mass.    Allergies  Allergen Reactions  . Prednisone Hives, Itching and Swelling  . Latex Rash  . Yeast-Related Products Rash    Wheezing    Outpatient Medications Prior to Visit  Medication Sig Dispense Refill  . acetaZOLAMIDE (DIAMOX) 250 MG tablet Take 500 mg by mouth daily.   0  . albuterol (PROVENTIL HFA;VENTOLIN HFA) 108 (90 BASE) MCG/ACT inhaler Inhale 2 puffs into the lungs every 6 (six) hours as needed for wheezing or shortness of breath.     . brimonidine (ALPHAGAN P) 0.1 % SOLN Apply 1 drop to eye 3 (three) times daily.     . cyclobenzaprine (FLEXERIL) 10 MG tablet Take 1 tablet (10 mg total) by mouth 2 (two) times daily as needed for muscle spasms. 20 tablet 0  . dicyclomine (BENTYL) 20 MG tablet Take 1 tablet (20 mg total) by mouth every 8 (eight) hours as needed for spasms (abdominal spasm/pain). 20  tablet 0  . Difluprednate 0.05 % EMUL Place 1 drop into the right eye 2 (two) times daily.     . fexofenadine (ALLEGRA) 180 MG tablet Take 180 mg by mouth daily as needed for allergies.    . hydrochlorothiazide (HYDRODIURIL) 25 MG tablet Take 25 mg by mouth daily.     Marland Kitchen ibuprofen (ADVIL) 600 MG tablet Take 1 tablet (600 mg total) by mouth every 6 (six) hours as needed. 30 tablet 0  . methocarbamol (ROBAXIN) 750 MG tablet Take 1 tablet (750 mg total) by mouth 2 (two) times daily. 10 tablet 0  . omeprazole (PRILOSEC) 20 MG capsule Take 1 capsule (20 mg total) by mouth daily. 30 capsule 0  . potassium chloride SA (KLOR-CON M20) 20 MEQ tablet Take 1 tablet (20 mEq total) by mouth daily. 7 tablet 0  . traMADol (ULTRAM) 50 MG tablet Take 50 mg by mouth 4 (four) times daily as needed for moderate pain.     . Travoprost, BAK Free, (TRAVATAN) 0.004 % SOLN ophthalmic solution Place 1 drop into both eyes at bedtime.     Evaristo Bury FLEXTOUCH 100 UNIT/ML FlexTouch Pen Inject 60 Units into the skin at bedtime.     No facility-administered medications prior to visit.   Past Medical History:  Diagnosis Date  . Advanced stage glaucoma    limited vision  . Asthma   . Glaucoma   . Hypertension   . Legal blindness   . Ovarian cyst ER   right  . Type 2 diabetes mellitus (HCC)  Past Surgical History:  Procedure Laterality Date  . CESAREAN SECTION  2002  . EYE SURGERY     several  . TUBAL LIGATION  2003   Social History   Socioeconomic History  . Marital status: Single    Spouse name: Not on file  . Number of children: Not on file  . Years of education: Not on file  . Highest education level: Not on file  Occupational History  . Occupation: sew  Tobacco Use  . Smoking status: Former Games developer  . Smokeless tobacco: Never Used  Vaping Use  . Vaping Use: Never used  Substance and Sexual Activity  . Alcohol use: No    Alcohol/week: 0.0 standard drinks  . Drug use: No  . Sexual activity: Yes     Partners: Male  Other Topics Concern  . Not on file  Social History Narrative  . Not on file   Social Determinants of Health   Financial Resource Strain: Not on file  Food Insecurity: Not on file  Transportation Needs: Not on file  Physical Activity: Not on file  Stress: Not on file  Social Connections: Not on file   Family History  Problem Relation Age of Onset  . Diabetes Mother   . Hyperlipidemia Mother   . Hypertension Mother   . Diabetes Father   . Hypertension Father   . Stroke Father   . Cancer Father   . Cancer Maternal Grandfather        lung     Review of Systems: Pertinent positive and negative review of systems were noted in the above HPI section. All other review of systems were otherwise negative.   Physical Exam: General: Well developed, well nourished, no acute distress Head: Normocephalic and atraumatic Eyes: Sclerae anicteric, EOMI Ears: Normal auditory acuity Mouth: Not examined, mask on during Covid-19 pandemic Neck: Supple, no masses or thyromegaly Lungs: Clear throughout to auscultation Heart: Regular rate and rhythm; no murmurs, rubs or bruits Abdomen: Soft, large non tender and non distended. No masses, hepatosplenomegaly or hernias noted. Normal Bowel sounds Rectal: Deferred to colonoscopy Musculoskeletal: Symmetrical with no gross deformities  Skin: No lesions on visible extremities Pulses:  Normal pulses noted Extremities: No clubbing, cyanosis, edema or deformities noted Neurological: Alert oriented x 4, legally blind Cervical Nodes:  No significant cervical adenopathy Inguinal Nodes: No significant inguinal adenopathy Psychological:  Alert and cooperative. Normal mood and affect   Assessment and Recommendations:  1.  Constipation associated with lower abdominal pain.  Continue high-fiber diet with adequate daily water intake and frequent exercise.  Add MiraLAX once daily.  Dicyclomine 20 mg 3 times daily as needed for abdominal pain.   Schedule colonoscopy. The risks (including bleeding, perforation, infection, missed lesions, medication reactions and possible hospitalization or surgery if complications occur), benefits, and alternatives to colonoscopy with possible biopsy and possible polypectomy were discussed with the patient and they consent to proceed.   2.  Abnormal CT of the uterus and left adnexal cysts.  If not already done pelvic US and GYN follow-up to be arranged by her PCP.  3. DM.  4. Legally blind, advanced stage glaucoma.    cc: Leilani Able, MD [address] [place],  [place] [address]

## 2021-01-03 ENCOUNTER — Ambulatory Visit (AMBULATORY_SURGERY_CENTER): Payer: Self-pay

## 2021-01-03 ENCOUNTER — Other Ambulatory Visit: Payer: Self-pay

## 2021-01-03 VITALS — Ht 65.0 in | Wt 236.0 lb

## 2021-01-03 DIAGNOSIS — K5900 Constipation, unspecified: Secondary | ICD-10-CM

## 2021-01-03 DIAGNOSIS — R1030 Lower abdominal pain, unspecified: Secondary | ICD-10-CM

## 2021-01-03 MED ORDER — NA SULFATE-K SULFATE-MG SULF 17.5-3.13-1.6 GM/177ML PO SOLN: 1.0000 | Freq: Once | ORAL | 0 refills | Status: AC

## 2021-01-03 NOTE — Progress Notes (Signed)
No allergies to soy or egg Pt is not on blood thinners or diet pills Denies issues with sedation/intubation Denies atrial flutter/fib Denies constipation   Pt is aware of Covid safety and care partner requirements.      

## 2021-01-10 ENCOUNTER — Other Ambulatory Visit: Payer: Self-pay

## 2021-01-10 ENCOUNTER — Encounter: Payer: Self-pay | Admitting: Gastroenterology

## 2021-01-10 ENCOUNTER — Ambulatory Visit (AMBULATORY_SURGERY_CENTER): Payer: Medicare Other | Admitting: Gastroenterology

## 2021-01-10 VITALS — BP 126/80 | HR 85 | Temp 98.2°F | Resp 9 | Ht 65.0 in | Wt 236.0 lb

## 2021-01-10 DIAGNOSIS — R1030 Lower abdominal pain, unspecified: Secondary | ICD-10-CM

## 2021-01-10 DIAGNOSIS — K5900 Constipation, unspecified: Secondary | ICD-10-CM

## 2021-01-10 MED ORDER — SODIUM CHLORIDE 0.9 % IV SOLN: 500.0000 mL | Freq: Once | INTRAVENOUS | Status: DC

## 2021-01-10 NOTE — Progress Notes (Signed)
Pt's blood sugar upon arrival to [facility]- 62.  She states "30" is normal but denies feelings of blood sugar being low. 8 oz of cranberry juice given and pt states "I am eating as soon as I leave!"

## 2021-01-10 NOTE — Patient Instructions (Signed)
Normal colonoscopy!!!  Next colonoscopy in 10 years  Continue your normal medications  YOU HAD AN ENDOSCOPIC PROCEDURE TODAY AT THE [facility]:   Refer to the procedure report that was given to you for any specific questions about what was found during the examination.  If the procedure report does not answer your questions, please call your gastroenterologist to clarify.  If you requested that your care partner not be given the details of your procedure findings, then the procedure report has been included in a sealed envelope for you to review at your convenience later.  YOU SHOULD EXPECT: Some feelings of bloating in the abdomen. Passage of more gas than usual.  Walking can help get rid of the air that was put into your GI tract during the procedure and reduce the bloating. If you had a lower endoscopy (such as a colonoscopy or flexible sigmoidoscopy) you may notice spotting of blood in your stool or on the toilet paper. If you underwent a bowel prep for your procedure, you may not have a normal bowel movement for a few days.  Please Note:  You might notice some irritation and congestion in your nose or some drainage.  This is from the oxygen used during your procedure.  There is no need for concern and it should clear up in a day or so.  SYMPTOMS TO REPORT IMMEDIATELY:   Following lower endoscopy (colonoscopy or flexible sigmoidoscopy):  Excessive amounts of blood in the stool  Significant tenderness or worsening of abdominal pains  Swelling of the abdomen that is new, acute  Fever of 100F or higher  For urgent or emergent issues, a gastroenterologist can be reached at any hour by calling [phone]. Do not use MyChart messaging for urgent concerns.    DIET:  We do recommend a small meal at first, but then you may proceed to your regular diet.  Drink plenty of fluids but you should avoid alcoholic beverages for 24 hours.  ACTIVITY:  You should plan to take it easy  for the rest of today and you should NOT DRIVE or use heavy machinery until tomorrow (because of the sedation medicines used during the test).    FOLLOW UP: Our staff will call the number listed on your records 48-72 hours following your procedure to check on you and address any questions or concerns that you may have regarding the information given to you following your procedure. If we do not reach you, we will leave a message.  We will attempt to reach you two times.  During this call, we will ask if you have developed any symptoms of COVID 19. If you develop any symptoms (ie: fever, flu-like symptoms, shortness of breath, cough etc.) before then, please call [phone].  If you test positive for Covid 19 in the 2 weeks post procedure, please call and report this information to us.     SIGNATURES/CONFIDENTIALITY: You and/or your care partner have signed paperwork which will be entered into your electronic medical record.  These signatures attest to the fact that that the information above on your After Visit Summary has been reviewed and is understood.  Full responsibility of the confidentiality of this discharge information lies with you and/or your care-partner.  

## 2021-01-10 NOTE — Progress Notes (Signed)
Vitals by [facility]. Pt's states no medical or surgical changes since previsit or office visit.  

## 2021-01-10 NOTE — Op Note (Signed)
[facility] Patient Name: Lauren Bradford Procedure Date: 01/10/2021 2:06 PM MRN: [id number] Endoscopist: Meryl Dare , MD Age: 45 Referring MD:  Date of Birth: 04-14-1976 Gender: Female Account #: [id number] Procedure:                Colonoscopy Indications:              Lower abdominal pain, Hematochezia Medicines:                Monitored Anesthesia Care Procedure:                Pre-Anesthesia Assessment:                           - Prior to the procedure, a History and Physical                            was performed, and patient medications and                            allergies were reviewed. The patient's tolerance of                            previous anesthesia was also reviewed. The risks                            and benefits of the procedure and the sedation                            options and risks were discussed with the patient.                            All questions were answered, and informed consent                            was obtained. Prior Anticoagulants: The patient has                            taken no previous anticoagulant or antiplatelet                            agents. ASA Grade Assessment: III - A patient with                            severe systemic disease. After reviewing the risks                            and benefits, the patient was deemed in                            satisfactory condition to undergo the procedure.                           After obtaining informed consent, the colonoscope  was passed under direct vision. Throughout the                            procedure, the patient's blood pressure, pulse, and                            oxygen saturations were monitored continuously. The                            Olympus CF-HQ190L [id number]) Colonoscope was                            introduced through the anus and advanced to the the                            cecum, identified by  appendiceal orifice and                            ileocecal valve. The ileocecal valve, appendiceal                            orifice, and rectum were photographed. The quality                            of the bowel preparation was good. The colonoscopy                            was performed without difficulty. The patient                            tolerated the procedure well. Scope In: 2:15:59 PM Scope Out: 2:30:11 PM Scope Withdrawal Time: 0 hours 11 minutes 6 seconds  Total Procedure Duration: 0 hours 14 minutes 12 seconds  Findings:                 The perianal and digital rectal examinations were                            normal.                           The entire examined colon appeared normal on direct                            and retroflexion views. Complications:            No immediate complications. Estimated blood loss:                            None. Estimated Blood Loss:     Estimated blood loss: none. Impression:               - The entire examined colon is normal on direct and                            retroflexion views.                           -  No specimens collected. Recommendation:           - Repeat colonoscopy in 10 years for screening                            purposes.                           - Patient has a contact number available for                            emergencies. The signs and symptoms of potential                            delayed complications were discussed with the                            patient. Return to normal activities tomorrow.                            Written discharge instructions were provided to the                            patient.                           - Resume previous diet.                           - Continue present medications. Meryl Dare, MD 01/10/2021 2:34:50 PM This report has been signed electronically.

## 2021-01-10 NOTE — Progress Notes (Signed)
A and O x3. Report to RN. Tolerated MAC anesthesia well.

## 2021-01-12 ENCOUNTER — Telehealth: Payer: Self-pay

## 2021-01-12 NOTE — Telephone Encounter (Signed)
No answer/ voicemail full °

## 2021-01-12 NOTE — Telephone Encounter (Signed)
No answer, unable to leave a message, B.Star Resler RN. 

## 2021-03-22 ENCOUNTER — Ambulatory Visit (HOSPITAL_COMMUNITY)
Admission: EM | Admit: 2021-03-22 | Discharge: 2021-03-22 | Disposition: A | Discharge status: Home / Self Care | Payer: Medicare Other | Attending: Emergency Medicine | Admitting: Emergency Medicine

## 2021-03-22 ENCOUNTER — Encounter (HOSPITAL_COMMUNITY): Payer: Self-pay | Admitting: Emergency Medicine

## 2021-03-22 ENCOUNTER — Other Ambulatory Visit: Payer: Self-pay

## 2021-03-22 DIAGNOSIS — L239 Allergic contact dermatitis, unspecified cause: Secondary | ICD-10-CM | POA: Diagnosis not present

## 2021-03-22 DIAGNOSIS — T7840XA Allergy, unspecified, initial encounter: Secondary | ICD-10-CM | POA: Diagnosis not present

## 2021-03-22 MED ORDER — IBUPROFEN 800 MG PO TABS
ORAL_TABLET | ORAL | Status: AC
  Filled 2021-03-22: qty 1

## 2021-03-22 MED ORDER — LORATADINE 10 MG PO TABS: 10.0000 mg | ORAL_TABLET | Freq: Every day | ORAL | 0 refills | Status: DC

## 2021-03-22 MED ORDER — DIPHENHYDRAMINE HCL 25 MG PO CAPS
50.0000 mg | ORAL_CAPSULE | Freq: Once | ORAL | Status: AC
  Administered 2021-03-22: 12:00:00 50 mg via ORAL

## 2021-03-22 MED ORDER — IBUPROFEN 600 MG PO TABS: 600.0000 mg | ORAL_TABLET | Freq: Four times a day (QID) | ORAL | 0 refills | Status: DC | PRN

## 2021-03-22 MED ORDER — FAMOTIDINE 20 MG PO TABS
40.0000 mg | ORAL_TABLET | Freq: Once | ORAL | Status: AC
  Administered 2021-03-22: 12:00:00 40 mg via ORAL

## 2021-03-22 MED ORDER — IBUPROFEN 800 MG PO TABS
800.0000 mg | ORAL_TABLET | Freq: Once | ORAL | Status: AC
  Administered 2021-03-22: 12:00:00 800 mg via ORAL

## 2021-03-22 MED ORDER — FAMOTIDINE 20 MG PO TABS
ORAL_TABLET | ORAL | Status: AC
  Filled 2021-03-22: qty 2

## 2021-03-22 MED ORDER — DIPHENHYDRAMINE HCL 25 MG PO CAPS
ORAL_CAPSULE | ORAL | Status: AC
  Filled 2021-03-22: qty 2

## 2021-03-22 MED ORDER — HYDROCORTISONE 2.5 % EX OINT: TOPICAL_OINTMENT | Freq: Two times a day (BID) | CUTANEOUS | 0 refills | Status: AC

## 2021-03-22 MED ORDER — FAMOTIDINE 20 MG PO TABS: 20.0000 mg | ORAL_TABLET | Freq: Two times a day (BID) | ORAL | 0 refills | Status: DC

## 2021-03-22 NOTE — Discharge Instructions (Addendum)
Continue cool compresses.  Take ibuprofen, Claritin, Pepcid, and try the hydrocortisone ointment.  Follow-up with your doctor as needed.  To the go to the ER for fevers above 100.4, pain with eye movements, or for any other concerns.

## 2021-03-22 NOTE — ED Provider Notes (Signed)
HPI  SUBJECTIVE:  Lauren Bradford is a 45 y.o. female who presents with bilateral periorbital erythema, pruritus, edema starting abruptly yesterday at 1500.  She reports increased tearing, nasal congestion.  She states that her whole face "feels puffy".  She denies fevers, angioedema of the lip or tongue, nausea, diarrhea, abdominal pain, syncope, lightheadedness, other rash, flushing, urticaria, headache, wheezing.  She states that she felt some shortness of breath earlier today, but used her albuterol inhaler with relief in her symptoms.  She reports mild pain with movement of her left eye.  She also took Benadryl last night with improvement in her symptoms.  No aggravating factors.  No change in her medications, new foods.  She is on 3 different eyedrops but has been on them for a long time and has had no recent change in the dosing.  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  She denies any other exposure to chemicals.  She has a past medical history of allergies, asthma, diabetes, hypertension.  No history of anaphylaxis.  LMP: Now.  PJK:DTOIZ, Jocelyn Lamer, MD   Past Medical History:  Diagnosis Date   Advanced stage glaucoma    limited vision   Asthma    Glaucoma    Hypertension    Legal blindness    Ovarian cyst ER   right   Type 2 diabetes mellitus (HCC)     Past Surgical History:  Procedure Laterality Date   CESAREAN SECTION  2002   EYE SURGERY     several   TUBAL LIGATION  2003    Family History  Problem Relation Age of Onset   Diabetes Mother    Hyperlipidemia Mother    Hypertension Mother    Diabetes Father    Hypertension Father    Stroke Father    Cancer Father    Cancer Maternal Grandfather        lung   Colon cancer Neg Hx    Colon polyps Neg Hx    Esophageal cancer Neg Hx    Rectal cancer Neg Hx    Stomach cancer Neg Hx     Social History   Tobacco Use   Smoking status: Former    Pack years: 0.00    Smokeless tobacco: Never  Vaping Use   Vaping Use: Never used  Substance Use Topics   Alcohol use: No    Alcohol/week: 0.0 standard drinks   Drug use: No    No current facility-administered medications for this encounter.  Current Outpatient Medications:    famotidine (PEPCID) 20 MG tablet, Take 1 tablet (20 mg total) by mouth 2 (two) times daily., Disp: 10 tablet, Rfl: 0   hydrocortisone 2.5 % ointment, Apply topically 2 (two) times daily for 14 days., Disp: 30 g, Rfl: 0   ibuprofen (ADVIL) 600 MG tablet, Take 1 tablet (600 mg total) by mouth every 6 (six) hours as needed., Disp: 30 tablet, Rfl: 0   loratadine (CLARITIN) 10 MG tablet, Take 1 tablet (10 mg total) by mouth daily for 5 days., Disp: 5 tablet, Rfl: 0   acetaZOLAMIDE (DIAMOX) 250 MG tablet, Take 500 mg by mouth daily. , Disp: , Rfl: 0   albuterol (PROVENTIL HFA;VENTOLIN HFA) 108 (90 BASE) MCG/ACT inhaler, Inhale 2 puffs into the lungs every 6 (six) hours as needed for wheezing or shortness of breath. , Disp: , Rfl:    brimonidine (ALPHAGAN P) 0.1 % SOLN, Apply  1 drop to eye 3 (three) times daily. , Disp: , Rfl:    cyclobenzaprine (FLEXERIL) 10 MG tablet, Take 1 tablet (10 mg total) by mouth 2 (two) times daily as needed for muscle spasms., Disp: 20 tablet, Rfl: 0   dicyclomine (BENTYL) 20 MG tablet, Take 1 tablet (20 mg total) by mouth every 8 (eight) hours as needed for spasms (abdominal spasm/pain)., Disp: 20 tablet, Rfl: 0   Difluprednate 0.05 % EMUL, Place 1 drop into the right eye 2 (two) times daily. , Disp: , Rfl:    hydrochlorothiazide (HYDRODIURIL) 25 MG tablet, Take 25 mg by mouth daily. , Disp: , Rfl:    omeprazole (PRILOSEC) 20 MG capsule, Take 1 capsule (20 mg total) by mouth daily., Disp: 30 capsule, Rfl: 0   potassium chloride SA (KLOR-CON M20) 20 MEQ tablet, Take 1 tablet (20 mEq total) by mouth daily., Disp: 7 tablet, Rfl: 0   ROCKLATAN 0.02-0.005 % SOLN, Apply to eye., Disp: , Rfl:    traMADol (ULTRAM) 50 MG  tablet, Take 50 mg by mouth 4 (four) times daily as needed for moderate pain. , Disp: , Rfl:    TRESIBA FLEXTOUCH 100 UNIT/ML FlexTouch Pen, Inject 60 Units into the skin at bedtime., Disp: , Rfl:   Allergies  Allergen Reactions   Prednisone Hives, Itching and Swelling   Latex Rash   Yeast-Related Products Rash    Wheezing      ROS  As noted in HPI.   Physical Exam  BP (!) 184/103 (BP Location: Right Arm)   Pulse 91   Temp 98.7 F (37.1 C) (Oral)   Resp 20   LMP 03/20/2021   SpO2 96%   Constitutional: Well developed, well nourished, no acute distress Eyes:  EOMI, conjunctiva normal bilaterally.  Positive extensive periorbital erythema, edema, tenderness.  Mild pain with extreme lateral gaze to the left eye.  No proptosis, evidence of entrapment.    HENT: Normocephalic, atraumatic,mucus membranes moist no angioedema of the lips or tongue.  Normal voice. Respiratory: Normal inspiratory effort, lungs clear bilaterally, good air movement Cardiovascular: Normal rate, regular rhythm, no murmurs rubs or gallop GI: nondistended skin: No other flushing, erythema, urticaria Musculoskeletal: no deformities Neurologic: Alert & oriented x 3, no focal neuro deficits Psychiatric: Speech and behavior appropriate   ED Course   Medications  diphenhydrAMINE (BENADRYL) capsule 50 mg (50 mg Oral Given 03/22/21 1145)  famotidine (PEPCID) tablet 40 mg (40 mg Oral Given 03/22/21 1145)  ibuprofen (ADVIL) tablet 800 mg (800 mg Oral Given 03/22/21 1158)    Orders Placed This Encounter  Procedures   Apply cold compresses    Standing Status:   Standing    Number of Occurrences:   1    No results found for this or any previous visit (from the past 24 hour(s)). No results found.  ED Clinical Impression  1. Allergic contact dermatitis, unspecified trigger   2. Allergic reaction, initial encounter      ED Assessment/Plan  Presentation consistent with a contact dermatitis.  There is  no evidence of anaphylaxis at this time.  Doubt acute bilateral periorbital cellulitis/post septal cellulitis.  Will give Pepcid, Benadryl, ibuprofen, cool compresses, and will reevaluate.  Patient states that her allergy to prednisone was to prednisone eyedrops, she reports eyelid swelling.  She has never had oral prednisone.  However, because she is a diabetic, and the reaction seems to be limited to around the eye, will withhold systemic steroids at this time.  Reevaluation, patient  looks and feels much better.    Plan to send home with hydrocortisone ointment which will help with inflammation, without elevating her blood sugars.  Home with ibuprofen, 5 days of Claritin, Pepcid, continue ice, work note for 2 days.  She has follow-up with her ophthalmologist next week.  Discussed MDM, treatment plan, and plan for follow-up with patient.ER return precautions given.  patient agrees with plan.   Meds ordered this encounter  Medications   diphenhydrAMINE (BENADRYL) capsule 50 mg   famotidine (PEPCID) tablet 40 mg   ibuprofen (ADVIL) tablet 800 mg   famotidine (PEPCID) 20 MG tablet    Sig: Take 1 tablet (20 mg total) by mouth 2 (two) times daily.    Dispense:  10 tablet    Refill:  0   hydrocortisone 2.5 % ointment    Sig: Apply topically 2 (two) times daily for 14 days.    Dispense:  30 g    Refill:  0   ibuprofen (ADVIL) 600 MG tablet    Sig: Take 1 tablet (600 mg total) by mouth every 6 (six) hours as needed.    Dispense:  30 tablet    Refill:  0   loratadine (CLARITIN) 10 MG tablet    Sig: Take 1 tablet (10 mg total) by mouth daily for 5 days.    Dispense:  5 tablet    Refill:  0      *This clinic note was created using Scientist, clinical (histocompatibility and immunogenetics). Therefore, there may be occasional mistakes despite careful proofreading.  ?    Domenick Gong, MD 03/22/21 1411

## 2021-03-22 NOTE — ED Triage Notes (Signed)
Patient presents to [facility] for evaluation of facial/eye swelling starting yesterday.  Patient states she started having shortness of breath this morning while at work with dizziness.  Dr. Chaney Malling at bedside at this time

## 2021-03-22 NOTE — ED Notes (Signed)
Patient in triage with Dr. Chaney Malling and this RN at this time

## 2021-03-28 DIAGNOSIS — Z13220 Encounter for screening for lipoid disorders: Secondary | ICD-10-CM | POA: Diagnosis not present

## 2021-03-28 DIAGNOSIS — Z0000 Encounter for general adult medical examination without abnormal findings: Secondary | ICD-10-CM | POA: Diagnosis not present

## 2021-03-28 DIAGNOSIS — E876 Hypokalemia: Secondary | ICD-10-CM | POA: Diagnosis not present

## 2021-03-28 DIAGNOSIS — E119 Type 2 diabetes mellitus without complications: Secondary | ICD-10-CM | POA: Diagnosis not present

## 2021-03-28 DIAGNOSIS — Z13228 Encounter for screening for other metabolic disorders: Secondary | ICD-10-CM | POA: Diagnosis not present

## 2021-03-28 DIAGNOSIS — I10 Essential (primary) hypertension: Secondary | ICD-10-CM | POA: Diagnosis not present

## 2021-03-31 DIAGNOSIS — L2389 Allergic contact dermatitis due to other agents: Secondary | ICD-10-CM | POA: Diagnosis not present

## 2021-04-07 DIAGNOSIS — Z947 Corneal transplant status: Secondary | ICD-10-CM | POA: Diagnosis not present

## 2021-04-14 DIAGNOSIS — L2389 Allergic contact dermatitis due to other agents: Secondary | ICD-10-CM | POA: Diagnosis not present

## 2021-04-26 DIAGNOSIS — H00029 Hordeolum internum unspecified eye, unspecified eyelid: Secondary | ICD-10-CM | POA: Diagnosis not present

## 2021-05-02 DIAGNOSIS — R5383 Other fatigue: Secondary | ICD-10-CM | POA: Diagnosis not present

## 2021-05-02 DIAGNOSIS — E119 Type 2 diabetes mellitus without complications: Secondary | ICD-10-CM | POA: Diagnosis not present

## 2021-05-02 DIAGNOSIS — I10 Essential (primary) hypertension: Secondary | ICD-10-CM | POA: Diagnosis not present

## 2021-05-02 DIAGNOSIS — K219 Gastro-esophageal reflux disease without esophagitis: Secondary | ICD-10-CM | POA: Diagnosis not present

## 2021-05-02 DIAGNOSIS — H548 Legal blindness, as defined in USA: Secondary | ICD-10-CM | POA: Diagnosis not present

## 2021-05-03 ENCOUNTER — Emergency Department (HOSPITAL_COMMUNITY)
Admission: EM | Admit: 2021-05-03 | Discharge: 2021-05-04 | Disposition: A | Discharge status: Home / Self Care | Payer: Medicare Other | Attending: Emergency Medicine | Admitting: Emergency Medicine

## 2021-05-03 ENCOUNTER — Emergency Department (HOSPITAL_COMMUNITY): Payer: Medicare Other

## 2021-05-03 ENCOUNTER — Encounter (HOSPITAL_COMMUNITY): Payer: Self-pay

## 2021-05-03 ENCOUNTER — Other Ambulatory Visit: Payer: Self-pay

## 2021-05-03 DIAGNOSIS — H401133 Primary open-angle glaucoma, bilateral, severe stage: Secondary | ICD-10-CM | POA: Diagnosis not present

## 2021-05-03 DIAGNOSIS — E1165 Type 2 diabetes mellitus with hyperglycemia: Secondary | ICD-10-CM | POA: Diagnosis not present

## 2021-05-03 DIAGNOSIS — Z794 Long term (current) use of insulin: Secondary | ICD-10-CM | POA: Insufficient documentation

## 2021-05-03 DIAGNOSIS — R079 Chest pain, unspecified: Secondary | ICD-10-CM | POA: Diagnosis not present

## 2021-05-03 DIAGNOSIS — Z91040 Latex allergy status: Secondary | ICD-10-CM | POA: Insufficient documentation

## 2021-05-03 DIAGNOSIS — I10 Essential (primary) hypertension: Secondary | ICD-10-CM | POA: Diagnosis not present

## 2021-05-03 DIAGNOSIS — E860 Dehydration: Secondary | ICD-10-CM | POA: Diagnosis not present

## 2021-05-03 DIAGNOSIS — R0789 Other chest pain: Secondary | ICD-10-CM | POA: Diagnosis not present

## 2021-05-03 DIAGNOSIS — R739 Hyperglycemia, unspecified: Secondary | ICD-10-CM

## 2021-05-03 DIAGNOSIS — Z79899 Other long term (current) drug therapy: Secondary | ICD-10-CM | POA: Insufficient documentation

## 2021-05-03 DIAGNOSIS — J45909 Unspecified asthma, uncomplicated: Secondary | ICD-10-CM | POA: Insufficient documentation

## 2021-05-03 DIAGNOSIS — Z87891 Personal history of nicotine dependence: Secondary | ICD-10-CM | POA: Diagnosis not present

## 2021-05-03 LAB — CBC WITH DIFFERENTIAL/PLATELET
Abs Immature Granulocytes: 0.02 10*3/uL (ref 0.00–0.07)
Basophils Absolute: 0 10*3/uL (ref 0.0–0.1)
Basophils Relative: 0 %
Eosinophils Absolute: 0.1 10*3/uL (ref 0.0–0.5)
Eosinophils Relative: 2 %
HCT: 38.5 % (ref 36.0–46.0)
Hemoglobin: 12.8 g/dL (ref 12.0–15.0)
Immature Granulocytes: 0 %
Lymphocytes Relative: 27 %
Lymphs Abs: 1.7 10*3/uL (ref 0.7–4.0)
MCH: 28.9 pg (ref 26.0–34.0)
MCHC: 33.2 g/dL (ref 30.0–36.0)
MCV: 86.9 fL (ref 80.0–100.0)
Monocytes Absolute: 0.5 10*3/uL (ref 0.1–1.0)
Monocytes Relative: 8 %
Neutro Abs: 3.8 10*3/uL (ref 1.7–7.7)
Neutrophils Relative %: 63 %
Platelets: 286 10*3/uL (ref 150–400)
RBC: 4.43 MIL/uL (ref 3.87–5.11)
RDW: 13.5 % (ref 11.5–15.5)
WBC: 6.1 10*3/uL (ref 4.0–10.5)
nRBC: 0 % (ref 0.0–0.2)

## 2021-05-03 LAB — COMPREHENSIVE METABOLIC PANEL
ALT: 17 U/L (ref 0–44)
AST: 15 U/L (ref 15–41)
Albumin: 3.7 g/dL (ref 3.5–5.0)
Alkaline Phosphatase: 97 U/L (ref 38–126)
Anion gap: 9 (ref 5–15)
BUN: 12 mg/dL (ref 6–20)
CO2: 27 mmol/L (ref 22–32)
Calcium: 9.4 mg/dL (ref 8.9–10.3)
Chloride: 96 mmol/L — ABNORMAL LOW (ref 98–111)
Creatinine, Ser: 0.69 mg/dL (ref 0.44–1.00)
GFR, Estimated: >60 mL/min (ref >60)
Glucose, Bld: 329 mg/dL — ABNORMAL HIGH (ref 70–99)
Potassium: 3.7 mmol/L (ref 3.5–5.1)
Sodium: 132 mmol/L — ABNORMAL LOW (ref 135–145)
Total Bilirubin: 0.4 mg/dL (ref 0.3–1.2)
Total Protein: 7.3 g/dL (ref 6.5–8.1)

## 2021-05-03 LAB — LIPASE, BLOOD: Lipase: 31 U/L (ref 11–51)

## 2021-05-03 LAB — BLOOD GAS, VENOUS
Acid-Base Excess: 2.1 mmol/L — ABNORMAL HIGH (ref 0.0–2.0)
Bicarbonate: 27.3 mmol/L (ref 20.0–28.0)
O2 Saturation: 75.8 %
Patient temperature: 98.6
pCO2, Ven: 47 mmHg (ref 44.0–60.0)
pH, Ven: 7.382 (ref 7.250–7.430)
pO2, Ven: 45.5 mmHg — ABNORMAL HIGH (ref 32.0–45.0)

## 2021-05-03 LAB — TROPONIN I (HIGH SENSITIVITY): Troponin I (High Sensitivity): <2 ng/L (ref <18)

## 2021-05-03 LAB — CBG MONITORING, ED: Glucose-Capillary: 361 mg/dL — ABNORMAL HIGH (ref 70–99)

## 2021-05-03 MED ORDER — SODIUM CHLORIDE 0.9 % IV BOLUS
1000.0000 mL | Freq: Once | INTRAVENOUS | Status: AC
  Administered 2021-05-03: 1000 mL via INTRAVENOUS

## 2021-05-03 NOTE — ED Provider Notes (Addendum)
Emergency Medicine Provider Triage Evaluation Note  Lauren Bradford , a 45 y.o. female  was evaluated in triage.  Pt complains of chief complaint of chest pain, started today, does not radiate, not associated with shortness of breath, becoming diaphoretic, lightheaded or dizziness.  She also notes that her glucose has been running high, states yesterday it was 500, states that she has been taking her medication without much success, she endorses extreme thirst, frequent urination, generalized fatigue, denies abdominal pain, nausea, vomiting, diarrhea..  Review of Systems  Positive: Chest pain, fatigue Negative: Shortness of breath, abdominal pain  Physical Exam  BP (!) 145/96 (BP Location: Left Arm)   Pulse 95   Temp 98.4 F (36.9 C) (Oral)   Resp 18   Ht 5\' 5"  (1.651 m)   Wt 107 kg   SpO2 97%   BMI 39.27 kg/m  Gen:   Awake, no distress   Resp:  Normal effort  MSK:   Moves extremities without difficulty  Other:    Medical Decision Making  Medically screening exam initiated at 9:27 PM.  Appropriate orders placed.  Lauren Bradford was informed that the remainder of the evaluation will be completed by another provider, this initial triage assessment does not replace that evaluation, and the importance of remaining in the ED until their evaluation is complete.  Patient presents with high glucose and chest pain patient need further work-up here in the ED, I do recommend making her a level 2 some concern for DKA, and Rooming this patient immediately.   Lavone Neri, PA-C 05/03/21 2129    2130, PA-C 05/03/21 2130    2131 [name], MD 05/03/21 [phone]

## 2021-05-03 NOTE — ED Provider Notes (Signed)
[facility]-EMERGENCY DEPT Provider Note   CSN: [id number] Arrival date & time: 05/03/21  1903     History Chief Complaint  Patient presents with   Hyperglycemia    Lauren Bradford is a 45 y.o. female.  45 yo F with a chief complaint of hyperglycemia.  Found to have a blood sugar of greater than 500 in her doctor's office yesterday and encouraged to come to the ED.  She had things to do and so after seeing her ophthalmologist today decided to check in.  She has had increased thirst.  Denies any abdominal pain.  Has had some chest pain off and on that is right-sided sharp and worse with deep breathing.  Denies any trauma to the chest.  Denies cough congestion or fever.  She denies noncompliance with her medicines.  The history is provided by the patient.  Hyperglycemia Associated symptoms: chest pain   Associated symptoms: no dizziness, no dysuria, no fever, no nausea, no shortness of breath and no vomiting   Illness Severity:  Moderate Onset quality:  Gradual Duration:  2 days Timing:  Constant Progression:  Worsening Chronicity:  New Associated symptoms: chest pain   Associated symptoms: no congestion, no cough, no diarrhea, no fever, no headaches, no myalgias, no nausea, no rhinorrhea, no shortness of breath, no vomiting and no wheezing       Past Medical History:  Diagnosis Date   Advanced stage glaucoma    limited vision   Asthma    Glaucoma    Hypertension    Legal blindness    Ovarian cyst ER   right   Type 2 diabetes mellitus (HCC)     Patient Active Problem List   Diagnosis Date Noted   Right ovarian cyst 07/21/2015   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 11/03/2014   Airway hyperreactivity 03/31/2013   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   BP (high blood pressure) 03/31/2013   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   Primary open angle glaucoma 10/03/2012    Past Surgical History:  Procedure Laterality Date   CESAREAN  SECTION  2002   EYE SURGERY     several   TUBAL LIGATION  2003     OB History     Gravida  3   Para  3   Term  3   Preterm      AB      Living  3      SAB      IAB      Ectopic      Multiple      Live Births  3           Family History  Problem Relation Age of Onset   Diabetes Mother    Hyperlipidemia Mother    Hypertension Mother    Diabetes Father    Hypertension Father    Stroke Father    Cancer Father    Cancer Maternal Grandfather        lung   Colon cancer Neg Hx    Colon polyps Neg Hx    Esophageal cancer Neg Hx    Rectal cancer Neg Hx    Stomach cancer Neg Hx     Social History   Tobacco Use   Smoking status: Former   Smokeless tobacco: Never  Vaping Use   Vaping Use: Never used  Substance Use Topics   Alcohol use: No    Alcohol/week: 0.0 standard drinks   Drug use: No  Home Medications Prior to Admission medications   Medication Sig Start Date End Date Taking? Authorizing Provider  diclofenac Sodium (VOLTAREN) 1 % GEL Apply 4 g topically 4 (four) times daily. 05/04/21  Yes Melene Plan, DO  acetaZOLAMIDE (DIAMOX) 250 MG tablet Take 500 mg by mouth daily.  07/10/15   [provider]  albuterol (PROVENTIL HFA;VENTOLIN HFA) 108 (90 BASE) MCG/ACT inhaler Inhale 2 puffs into the lungs every 6 (six) hours as needed for wheezing or shortness of breath.     [provider]  brimonidine (ALPHAGAN P) 0.1 % SOLN Apply 1 drop to eye 3 (three) times daily.  09/12/14   [provider]  cyclobenzaprine (FLEXERIL) 10 MG tablet Take 1 tablet (10 mg total) by mouth 2 (two) times daily as needed for muscle spasms. 03/13/20   Janeece Fitting, MD  dicyclomine (BENTYL) 20 MG tablet Take 1 tablet (20 mg total) by mouth every 8 (eight) hours as needed for spasms (abdominal spasm/pain). 09/25/19   Raeford Razor, MD  Difluprednate 0.05 % EMUL Place 1 drop into the right eye 2 (two) times daily.  09/21/14   [provider]   famotidine (PEPCID) 20 MG tablet Take 1 tablet (20 mg total) by mouth 2 (two) times daily. 03/22/21   Domenick Gong, MD  hydrochlorothiazide (HYDRODIURIL) 25 MG tablet Take 25 mg by mouth daily.  08/13/09   [provider]  ibuprofen (ADVIL) 600 MG tablet Take 1 tablet (600 mg total) by mouth every 6 (six) hours as needed. 03/22/21   Domenick Gong, MD  loratadine (CLARITIN) 10 MG tablet Take 1 tablet (10 mg total) by mouth daily for 5 days. 03/22/21 03/27/21  Domenick Gong, MD  omeprazole (PRILOSEC) 20 MG capsule Take 1 capsule (20 mg total) by mouth daily. 06/16/20   Palumbo, April, MD  potassium chloride SA (KLOR-CON M20) 20 MEQ tablet Take 1 tablet (20 mEq total) by mouth daily. 09/22/16   Loleta Rose, MD  ROCKLATAN 0.02-0.005 % SOLN Apply to eye. 12/30/20   [provider]  traMADol (ULTRAM) 50 MG tablet Take 50 mg by mouth 4 (four) times daily as needed for moderate pain.  07/07/19   [provider]  TRESIBA FLEXTOUCH 100 UNIT/ML FlexTouch Pen Inject 60 Units into the skin at bedtime. 11/22/20   [provider]    Allergies    Prednisone, Latex, and Yeast-related products  Review of Systems   Review of Systems  Constitutional:  Negative for chills and fever.  HENT:  Negative for congestion and rhinorrhea.   Eyes:  Negative for redness and visual disturbance.  Respiratory:  Negative for cough, shortness of breath and wheezing.   Cardiovascular:  Positive for chest pain. Negative for palpitations.  Gastrointestinal:  Negative for constipation, diarrhea, nausea and vomiting.  Genitourinary:  Negative for dysuria and urgency.  Musculoskeletal:  Negative for arthralgias and myalgias.  Skin:  Negative for pallor and wound.  Neurological:  Negative for dizziness and headaches.   Physical Exam Updated Vital Signs BP (!) 145/99   Pulse 81   Temp 98.4 F (36.9 C) (Oral)   Resp 13   Ht 5\' 5"  (1.651 m)   Wt 107 kg   SpO2 100%   BMI 39.27 kg/m    Physical Exam Vitals and nursing note reviewed.  Constitutional:      General: She is not in acute distress.    Appearance: She is well-developed. She is not diaphoretic.  HENT:     Head: Normocephalic and  atraumatic.  Eyes:     Pupils: Pupils are equal, round, and reactive to light.  Cardiovascular:     Rate and Rhythm: Normal rate and regular rhythm.     Heart sounds: No murmur heard.   No friction rub. No gallop.  Pulmonary:     Effort: Pulmonary effort is normal.     Breath sounds: No wheezing or rales.  Abdominal:     General: There is no distension.     Palpations: Abdomen is soft.     Tenderness: There is no abdominal tenderness.  Musculoskeletal:        General: No tenderness.     Cervical back: Normal range of motion and neck supple.  Skin:    General: Skin is warm and dry.  Neurological:     Mental Status: She is alert and oriented to person, place, and time.  Psychiatric:        Behavior: Behavior normal.    ED Results / Procedures / Treatments   Labs (all labs ordered are listed, but only abnormal results are displayed) Labs Reviewed  COMPREHENSIVE METABOLIC PANEL - Abnormal; Notable for the following components:      Result Value   Sodium 132 (*)    Chloride 96 (*)    Glucose, Bld 329 (*)    All other components within normal limits  BLOOD GAS, VENOUS - Abnormal; Notable for the following components:   pO2, Ven 45.5 (*)    Acid-Base Excess 2.1 (*)    All other components within normal limits  URINALYSIS, ROUTINE W REFLEX MICROSCOPIC - Abnormal; Notable for the following components:   Color, Urine STRAW (*)    Glucose, UA >=500 (*)    Bacteria, UA RARE (*)    All other components within normal limits  CBG MONITORING, ED - Abnormal; Notable for the following components:   Glucose-Capillary 361 (*)    All other components within normal limits  CBG MONITORING, ED - Abnormal; Notable for the following components:   Glucose-Capillary 272 (*)    All  other components within normal limits  CBC WITH DIFFERENTIAL/PLATELET  LIPASE, BLOOD  PREGNANCY, URINE  TROPONIN I (HIGH SENSITIVITY)  TROPONIN I (HIGH SENSITIVITY)    EKG EKG Interpretation  Date/Time:  Tuesday May 03 2021 23:09:03 EDT Ventricular Rate:  89 PR Interval:  153 QRS Duration: 86 QT Interval:  361 QTC Calculation: 440 R Axis:   8 Text Interpretation: Sinus rhythm Low voltage, precordial leads Consider anterior infarct Baseline wander in lead(s) V1 No significant change since last tracing Confirmed by Melene PlanFloyd, Elfriede Bonini [phone]([id number]) on 05/03/2021 11:15:37 PM  Radiology DG Chest 2 View  Result Date: 05/03/2021 CLINICAL DATA:  Chest pain EXAM: CHEST - 2 VIEW COMPARISON:  06/16/2019 FINDINGS: No consolidation, features of edema, pneumothorax, or effusion. Pulmonary vascularity is normally distributed. The cardiomediastinal contours are unremarkable. No acute osseous or soft tissue abnormality. IMPRESSION: No acute cardiopulmonary abnormality. Electronically Signed   By: Kreg ShropshirePrice  DeHay M.D.   On: 05/03/2021 21:54    Procedures Procedures   Medications Ordered in ED Medications  sodium chloride 0.9 % bolus 1,000 mL (0 mLs Intravenous Stopped 05/04/21 0058)  insulin aspart (novoLOG) injection 10 Units (10 Units Subcutaneous Given 05/04/21 0058)    ED Course  I have reviewed the triage vital signs and the nursing notes.  Pertinent labs & imaging results that were available during my care of the patient were reviewed by me and considered in my medical decision making (see chart for  details).    MDM Rules/Calculators/A&P                           45 yo F here with a chief complaints of hyperglycemia.  Patient metabolic panel with hyperglycemia without acidosis or anion gap.  Mildly dehydrated given a bolus of fluids here.  Given a bolus dose of insulin.  We will have her follow-up with her family doctor.  She is complaining of atypical chest pain that is reproduced on exam.  Most  likely musculoskeletal.  Troponin negative.  EKG without concerning finding.  Chest x-ray viewed by me without focal after pneumothorax.  1:02 AM:  I have discussed the diagnosis/risks/treatment options with the patient and believe the pt to be eligible for discharge home to follow-up with PCP. We also discussed returning to the ED immediately if new or worsening sx occur. We discussed the sx which are most concerning (e.g., sudden worsening pain, fever, inability to tolerate by mouth) that necessitate immediate return. Medications administered to the patient during their visit and any new prescriptions provided to the patient are listed below.  Medications given during this visit Medications  sodium chloride 0.9 % bolus 1,000 mL (0 mLs Intravenous Stopped 05/04/21 0058)  insulin aspart (novoLOG) injection 10 Units (10 Units Subcutaneous Given 05/04/21 0058)     The patient appears reasonably screen and/or stabilized for discharge and I doubt any other medical condition or other [facility] requiring further screening, evaluation, or treatment in the ED at this time prior to discharge.     Final Clinical Impression(s) / ED Diagnoses Final diagnoses:  Hyperglycemia  Dehydration    Rx / DC Orders ED Discharge Orders          Ordered    diclofenac Sodium (VOLTAREN) 1 % GEL  4 times daily        05/04/21 0047             Melene Plan, DO 05/04/21 0102

## 2021-05-03 NOTE — ED Triage Notes (Addendum)
Pt was seen yesterday at her primary doctor and was informed to come to the ED and be seen due to a high blood sugar of 502. Pt is blind in her left eye and has minimal vision in her right.

## 2021-05-04 DIAGNOSIS — E1165 Type 2 diabetes mellitus with hyperglycemia: Secondary | ICD-10-CM | POA: Diagnosis not present

## 2021-05-04 LAB — URINALYSIS, ROUTINE W REFLEX MICROSCOPIC
Bilirubin Urine: NEGATIVE
Glucose, UA: >=500 mg/dL — AB
Hgb urine dipstick: NEGATIVE
Ketones, ur: NEGATIVE mg/dL
Leukocytes,Ua: NEGATIVE
Nitrite: NEGATIVE
Protein, ur: NEGATIVE mg/dL
Specific Gravity, Urine: 1.022 (ref 1.005–1.030)
pH: 6 (ref 5.0–8.0)

## 2021-05-04 LAB — CBG MONITORING, ED: Glucose-Capillary: 272 mg/dL — ABNORMAL HIGH (ref 70–99)

## 2021-05-04 LAB — PREGNANCY, URINE: Preg Test, Ur: NEGATIVE

## 2021-05-04 MED ORDER — DICLOFENAC SODIUM 1 % EX GEL: 4.0000 g | Freq: Four times a day (QID) | CUTANEOUS | 0 refills | Status: DC

## 2021-05-04 MED ORDER — INSULIN ASPART 100 UNIT/ML IJ SOLN
10.0000 [IU] | Freq: Once | INTRAMUSCULAR | Status: AC
  Administered 2021-05-04: 10 [IU] via SUBCUTANEOUS
  Filled 2021-05-04: qty 0.1

## 2021-05-04 NOTE — Discharge Instructions (Addendum)
Your blood work showed that your blood sugar was high but not so high that we need to keep you in the hospital.  Please discuss this with your family doctor they may need to adjust your insulin dosing at home.  Your EKG and blood work and history of your illness are unlikely to be a heart attack.  If it hurts when I poke you in the chest it is most likely musculoskeletal.  Please take Tylenol around-the-clock.  I have prescribed you a gel that you can rub on the area that hurts you.

## 2021-10-13 DIAGNOSIS — H548 Legal blindness, as defined in USA: Secondary | ICD-10-CM | POA: Diagnosis not present

## 2021-10-13 DIAGNOSIS — I10 Essential (primary) hypertension: Secondary | ICD-10-CM | POA: Diagnosis not present

## 2021-10-13 DIAGNOSIS — Z13228 Encounter for screening for other metabolic disorders: Secondary | ICD-10-CM | POA: Diagnosis not present

## 2021-10-13 DIAGNOSIS — K219 Gastro-esophageal reflux disease without esophagitis: Secondary | ICD-10-CM | POA: Diagnosis not present

## 2021-10-13 DIAGNOSIS — Z13220 Encounter for screening for lipoid disorders: Secondary | ICD-10-CM | POA: Diagnosis not present

## 2021-10-13 DIAGNOSIS — Z0000 Encounter for general adult medical examination without abnormal findings: Secondary | ICD-10-CM | POA: Diagnosis not present

## 2021-10-13 DIAGNOSIS — E119 Type 2 diabetes mellitus without complications: Secondary | ICD-10-CM | POA: Diagnosis not present

## 2021-10-24 DIAGNOSIS — I10 Essential (primary) hypertension: Secondary | ICD-10-CM | POA: Diagnosis not present

## 2021-10-24 DIAGNOSIS — E119 Type 2 diabetes mellitus without complications: Secondary | ICD-10-CM | POA: Diagnosis not present

## 2021-10-24 DIAGNOSIS — H548 Legal blindness, as defined in USA: Secondary | ICD-10-CM | POA: Diagnosis not present

## 2021-11-01 DIAGNOSIS — H0011 Chalazion right upper eyelid: Secondary | ICD-10-CM | POA: Diagnosis not present

## 2021-11-01 DIAGNOSIS — H00029 Hordeolum internum unspecified eye, unspecified eyelid: Secondary | ICD-10-CM | POA: Diagnosis not present

## 2021-11-07 DIAGNOSIS — K219 Gastro-esophageal reflux disease without esophagitis: Secondary | ICD-10-CM | POA: Diagnosis not present

## 2021-11-07 DIAGNOSIS — R5383 Other fatigue: Secondary | ICD-10-CM | POA: Diagnosis not present

## 2021-11-07 DIAGNOSIS — E119 Type 2 diabetes mellitus without complications: Secondary | ICD-10-CM | POA: Diagnosis not present

## 2021-11-07 DIAGNOSIS — H548 Legal blindness, as defined in USA: Secondary | ICD-10-CM | POA: Diagnosis not present

## 2021-11-07 DIAGNOSIS — J301 Allergic rhinitis due to pollen: Secondary | ICD-10-CM | POA: Diagnosis not present

## 2021-11-07 DIAGNOSIS — I10 Essential (primary) hypertension: Secondary | ICD-10-CM | POA: Diagnosis not present

## 2021-11-08 DIAGNOSIS — H401133 Primary open-angle glaucoma, bilateral, severe stage: Secondary | ICD-10-CM | POA: Diagnosis not present

## 2022-01-23 DIAGNOSIS — H548 Legal blindness, as defined in USA: Secondary | ICD-10-CM | POA: Diagnosis not present

## 2022-01-23 DIAGNOSIS — E119 Type 2 diabetes mellitus without complications: Secondary | ICD-10-CM | POA: Diagnosis not present

## 2022-01-23 DIAGNOSIS — K219 Gastro-esophageal reflux disease without esophagitis: Secondary | ICD-10-CM | POA: Diagnosis not present

## 2022-01-23 DIAGNOSIS — I10 Essential (primary) hypertension: Secondary | ICD-10-CM | POA: Diagnosis not present

## 2022-01-23 DIAGNOSIS — E782 Mixed hyperlipidemia: Secondary | ICD-10-CM | POA: Diagnosis not present

## 2022-01-31 DIAGNOSIS — H401133 Primary open-angle glaucoma, bilateral, severe stage: Secondary | ICD-10-CM | POA: Diagnosis not present

## 2022-02-06 DIAGNOSIS — Z79899 Other long term (current) drug therapy: Secondary | ICD-10-CM | POA: Diagnosis not present

## 2022-02-06 DIAGNOSIS — I10 Essential (primary) hypertension: Secondary | ICD-10-CM | POA: Diagnosis not present

## 2022-02-06 DIAGNOSIS — Z794 Long term (current) use of insulin: Secondary | ICD-10-CM | POA: Diagnosis not present

## 2022-02-06 DIAGNOSIS — H401133 Primary open-angle glaucoma, bilateral, severe stage: Secondary | ICD-10-CM | POA: Diagnosis not present

## 2022-02-06 DIAGNOSIS — H18231 Secondary corneal edema, right eye: Secondary | ICD-10-CM | POA: Diagnosis not present

## 2022-02-06 DIAGNOSIS — H1820 Unspecified corneal edema: Secondary | ICD-10-CM | POA: Diagnosis not present

## 2022-02-06 DIAGNOSIS — E1339 Other specified diabetes mellitus with other diabetic ophthalmic complication: Secondary | ICD-10-CM | POA: Diagnosis not present

## 2022-02-06 DIAGNOSIS — Z87891 Personal history of nicotine dependence: Secondary | ICD-10-CM | POA: Diagnosis not present

## 2022-02-06 DIAGNOSIS — Z7984 Long term (current) use of oral hypoglycemic drugs: Secondary | ICD-10-CM | POA: Diagnosis not present

## 2022-02-06 DIAGNOSIS — Z961 Presence of intraocular lens: Secondary | ICD-10-CM | POA: Diagnosis not present

## 2022-02-06 DIAGNOSIS — E119 Type 2 diabetes mellitus without complications: Secondary | ICD-10-CM | POA: Diagnosis not present

## 2022-02-06 DIAGNOSIS — H42 Glaucoma in diseases classified elsewhere: Secondary | ICD-10-CM | POA: Diagnosis not present

## 2022-03-21 DIAGNOSIS — Z79899 Other long term (current) drug therapy: Secondary | ICD-10-CM | POA: Diagnosis not present

## 2022-03-21 DIAGNOSIS — Z87891 Personal history of nicotine dependence: Secondary | ICD-10-CM | POA: Diagnosis not present

## 2022-03-21 DIAGNOSIS — H1820 Unspecified corneal edema: Secondary | ICD-10-CM | POA: Diagnosis not present

## 2022-03-21 DIAGNOSIS — J4520 Mild intermittent asthma, uncomplicated: Secondary | ICD-10-CM | POA: Diagnosis not present

## 2022-03-21 DIAGNOSIS — G4730 Sleep apnea, unspecified: Secondary | ICD-10-CM | POA: Diagnosis not present

## 2022-03-21 DIAGNOSIS — E1136 Type 2 diabetes mellitus with diabetic cataract: Secondary | ICD-10-CM | POA: Diagnosis not present

## 2022-03-21 DIAGNOSIS — Z794 Long term (current) use of insulin: Secondary | ICD-10-CM | POA: Diagnosis not present

## 2022-03-21 DIAGNOSIS — I10 Essential (primary) hypertension: Secondary | ICD-10-CM | POA: Diagnosis not present

## 2022-03-21 DIAGNOSIS — H538 Other visual disturbances: Secondary | ICD-10-CM | POA: Diagnosis not present

## 2022-03-21 DIAGNOSIS — H401133 Primary open-angle glaucoma, bilateral, severe stage: Secondary | ICD-10-CM | POA: Diagnosis not present

## 2022-04-18 DIAGNOSIS — E119 Type 2 diabetes mellitus without complications: Secondary | ICD-10-CM | POA: Diagnosis not present

## 2022-04-18 DIAGNOSIS — H548 Legal blindness, as defined in USA: Secondary | ICD-10-CM | POA: Diagnosis not present

## 2022-04-18 DIAGNOSIS — I10 Essential (primary) hypertension: Secondary | ICD-10-CM | POA: Diagnosis not present

## 2022-04-25 DIAGNOSIS — Z947 Corneal transplant status: Secondary | ICD-10-CM | POA: Diagnosis not present

## 2022-04-25 DIAGNOSIS — H401133 Primary open-angle glaucoma, bilateral, severe stage: Secondary | ICD-10-CM | POA: Diagnosis not present

## 2022-05-02 DIAGNOSIS — H401133 Primary open-angle glaucoma, bilateral, severe stage: Secondary | ICD-10-CM | POA: Diagnosis not present

## 2022-07-18 DIAGNOSIS — I10 Essential (primary) hypertension: Secondary | ICD-10-CM | POA: Diagnosis not present

## 2022-07-18 DIAGNOSIS — E119 Type 2 diabetes mellitus without complications: Secondary | ICD-10-CM | POA: Diagnosis not present

## 2022-07-18 DIAGNOSIS — H548 Legal blindness, as defined in USA: Secondary | ICD-10-CM | POA: Diagnosis not present

## 2022-07-18 DIAGNOSIS — R5383 Other fatigue: Secondary | ICD-10-CM | POA: Diagnosis not present

## 2022-07-18 DIAGNOSIS — K219 Gastro-esophageal reflux disease without esophagitis: Secondary | ICD-10-CM | POA: Diagnosis not present

## 2022-07-18 DIAGNOSIS — K5900 Constipation, unspecified: Secondary | ICD-10-CM | POA: Diagnosis not present

## 2022-07-18 DIAGNOSIS — Z87891 Personal history of nicotine dependence: Secondary | ICD-10-CM | POA: Diagnosis not present

## 2022-09-07 DIAGNOSIS — Z947 Corneal transplant status: Secondary | ICD-10-CM | POA: Diagnosis not present

## 2022-11-07 DIAGNOSIS — Z947 Corneal transplant status: Secondary | ICD-10-CM | POA: Diagnosis not present

## 2022-11-07 DIAGNOSIS — H401133 Primary open-angle glaucoma, bilateral, severe stage: Secondary | ICD-10-CM | POA: Diagnosis not present

## 2022-11-21 DIAGNOSIS — Z0000 Encounter for general adult medical examination without abnormal findings: Secondary | ICD-10-CM | POA: Diagnosis not present

## 2022-11-21 DIAGNOSIS — E119 Type 2 diabetes mellitus without complications: Secondary | ICD-10-CM | POA: Diagnosis not present

## 2022-11-21 DIAGNOSIS — I10 Essential (primary) hypertension: Secondary | ICD-10-CM | POA: Diagnosis not present

## 2022-12-29 DIAGNOSIS — H01111 Allergic dermatitis of right upper eyelid: Secondary | ICD-10-CM | POA: Diagnosis not present

## 2023-01-12 DIAGNOSIS — L2389 Allergic contact dermatitis due to other agents: Secondary | ICD-10-CM | POA: Diagnosis not present

## 2023-01-18 DIAGNOSIS — E119 Type 2 diabetes mellitus without complications: Secondary | ICD-10-CM | POA: Diagnosis not present

## 2023-01-18 DIAGNOSIS — I10 Essential (primary) hypertension: Secondary | ICD-10-CM | POA: Diagnosis not present

## 2023-01-18 DIAGNOSIS — K219 Gastro-esophageal reflux disease without esophagitis: Secondary | ICD-10-CM | POA: Diagnosis not present

## 2023-01-18 DIAGNOSIS — R5383 Other fatigue: Secondary | ICD-10-CM | POA: Diagnosis not present

## 2023-01-18 DIAGNOSIS — H548 Legal blindness, as defined in USA: Secondary | ICD-10-CM | POA: Diagnosis not present

## 2023-04-18 DIAGNOSIS — I10 Essential (primary) hypertension: Secondary | ICD-10-CM | POA: Diagnosis not present

## 2023-04-18 DIAGNOSIS — K219 Gastro-esophageal reflux disease without esophagitis: Secondary | ICD-10-CM | POA: Diagnosis not present

## 2023-04-18 DIAGNOSIS — R5383 Other fatigue: Secondary | ICD-10-CM | POA: Diagnosis not present

## 2023-04-18 DIAGNOSIS — E119 Type 2 diabetes mellitus without complications: Secondary | ICD-10-CM | POA: Diagnosis not present

## 2023-04-24 DIAGNOSIS — H401133 Primary open-angle glaucoma, bilateral, severe stage: Secondary | ICD-10-CM | POA: Diagnosis not present

## 2023-07-02 DIAGNOSIS — E119 Type 2 diabetes mellitus without complications: Secondary | ICD-10-CM | POA: Diagnosis not present

## 2023-07-02 DIAGNOSIS — Z0000 Encounter for general adult medical examination without abnormal findings: Secondary | ICD-10-CM | POA: Diagnosis not present

## 2023-07-10 DIAGNOSIS — Z947 Corneal transplant status: Secondary | ICD-10-CM | POA: Diagnosis not present

## 2023-07-30 DIAGNOSIS — E119 Type 2 diabetes mellitus without complications: Secondary | ICD-10-CM | POA: Diagnosis not present

## 2023-07-30 DIAGNOSIS — M5459 Other low back pain: Secondary | ICD-10-CM | POA: Diagnosis not present

## 2023-07-30 DIAGNOSIS — I10 Essential (primary) hypertension: Secondary | ICD-10-CM | POA: Diagnosis not present

## 2023-07-30 DIAGNOSIS — K219 Gastro-esophageal reflux disease without esophagitis: Secondary | ICD-10-CM | POA: Diagnosis not present

## 2023-08-12 ENCOUNTER — Emergency Department (HOSPITAL_COMMUNITY): Payer: 59

## 2023-08-12 ENCOUNTER — Emergency Department (HOSPITAL_COMMUNITY)
Admission: EM | Admit: 2023-08-12 | Discharge: 2023-08-12 | Disposition: A | Discharge status: Home / Self Care | Payer: 59 | Attending: Emergency Medicine | Admitting: Emergency Medicine

## 2023-08-12 DIAGNOSIS — K5730 Diverticulosis of large intestine without perforation or abscess without bleeding: Secondary | ICD-10-CM | POA: Diagnosis not present

## 2023-08-12 DIAGNOSIS — J45909 Unspecified asthma, uncomplicated: Secondary | ICD-10-CM | POA: Diagnosis not present

## 2023-08-12 DIAGNOSIS — R590 Localized enlarged lymph nodes: Secondary | ICD-10-CM | POA: Diagnosis not present

## 2023-08-12 DIAGNOSIS — K689 Other disorders of retroperitoneum: Secondary | ICD-10-CM | POA: Diagnosis not present

## 2023-08-12 DIAGNOSIS — E119 Type 2 diabetes mellitus without complications: Secondary | ICD-10-CM | POA: Insufficient documentation

## 2023-08-12 DIAGNOSIS — K862 Cyst of pancreas: Secondary | ICD-10-CM | POA: Insufficient documentation

## 2023-08-12 DIAGNOSIS — Z79899 Other long term (current) drug therapy: Secondary | ICD-10-CM | POA: Diagnosis not present

## 2023-08-12 DIAGNOSIS — I10 Essential (primary) hypertension: Secondary | ICD-10-CM | POA: Insufficient documentation

## 2023-08-12 DIAGNOSIS — M549 Dorsalgia, unspecified: Secondary | ICD-10-CM | POA: Diagnosis not present

## 2023-08-12 DIAGNOSIS — Z91040 Latex allergy status: Secondary | ICD-10-CM | POA: Diagnosis not present

## 2023-08-12 DIAGNOSIS — R112 Nausea with vomiting, unspecified: Secondary | ICD-10-CM | POA: Insufficient documentation

## 2023-08-12 DIAGNOSIS — R1011 Right upper quadrant pain: Secondary | ICD-10-CM

## 2023-08-12 DIAGNOSIS — R109 Unspecified abdominal pain: Secondary | ICD-10-CM | POA: Diagnosis present

## 2023-08-12 DIAGNOSIS — R404 Transient alteration of awareness: Secondary | ICD-10-CM | POA: Diagnosis not present

## 2023-08-12 LAB — HCG, SERUM, QUALITATIVE: Preg, Serum: NEGATIVE

## 2023-08-12 LAB — URINALYSIS, W/ REFLEX TO CULTURE (INFECTION SUSPECTED)
Bacteria, UA: NONE SEEN
Bilirubin Urine: NEGATIVE
Glucose, UA: 150 mg/dL — AB
Hgb urine dipstick: NEGATIVE
Ketones, ur: NEGATIVE mg/dL
Leukocytes,Ua: NEGATIVE
Nitrite: NEGATIVE
Protein, ur: NEGATIVE mg/dL
Specific Gravity, Urine: 1.015 (ref 1.005–1.030)
pH: 5 (ref 5.0–8.0)

## 2023-08-12 LAB — COMPREHENSIVE METABOLIC PANEL
ALT: 27 U/L (ref 0–44)
AST: 22 U/L (ref 15–41)
Albumin: 3.9 g/dL (ref 3.5–5.0)
Alkaline Phosphatase: 114 U/L (ref 38–126)
Anion gap: 9 (ref 5–15)
BUN: 19 mg/dL (ref 6–20)
CO2: 24 mmol/L (ref 22–32)
Calcium: 9 mg/dL (ref 8.9–10.3)
Chloride: 98 mmol/L (ref 98–111)
Creatinine, Ser: 0.74 mg/dL (ref 0.44–1.00)
GFR, Estimated: >60 mL/min (ref >60)
Glucose, Bld: 260 mg/dL — ABNORMAL HIGH (ref 70–99)
Potassium: 3.7 mmol/L (ref 3.5–5.1)
Sodium: 131 mmol/L — ABNORMAL LOW (ref 135–145)
Total Bilirubin: 0.3 mg/dL (ref 0.3–1.2)
Total Protein: 7.9 g/dL (ref 6.5–8.1)

## 2023-08-12 LAB — CBC WITH DIFFERENTIAL/PLATELET
Abs Immature Granulocytes: 0.05 10*3/uL (ref 0.00–0.07)
Basophils Absolute: 0 10*3/uL (ref 0.0–0.1)
Basophils Relative: 0 %
Eosinophils Absolute: 0 10*3/uL (ref 0.0–0.5)
Eosinophils Relative: 1 %
HCT: 35.8 % — ABNORMAL LOW (ref 36.0–46.0)
Hemoglobin: 12 g/dL (ref 12.0–15.0)
Immature Granulocytes: 1 %
Lymphocytes Relative: 13 %
Lymphs Abs: 0.9 10*3/uL (ref 0.7–4.0)
MCH: 29.3 pg (ref 26.0–34.0)
MCHC: 33.5 g/dL (ref 30.0–36.0)
MCV: 87.3 fL (ref 80.0–100.0)
Monocytes Absolute: 0.4 10*3/uL (ref 0.1–1.0)
Monocytes Relative: 6 %
Neutro Abs: 5.4 10*3/uL (ref 1.7–7.7)
Neutrophils Relative %: 79 %
Platelets: 276 10*3/uL (ref 150–400)
RBC: 4.1 MIL/uL (ref 3.87–5.11)
RDW: 13 % (ref 11.5–15.5)
WBC: 6.8 10*3/uL (ref 4.0–10.5)
nRBC: 0 % (ref 0.0–0.2)

## 2023-08-12 LAB — SEDIMENTATION RATE: Sed Rate: 46 mm/h — ABNORMAL HIGH (ref 0–22)

## 2023-08-12 LAB — C-REACTIVE PROTEIN: CRP: 2.7 mg/dL — ABNORMAL HIGH (ref <1.0)

## 2023-08-12 LAB — LACTATE DEHYDROGENASE: LDH: 100 U/L (ref 98–192)

## 2023-08-12 MED ORDER — HYOSCYAMINE SULFATE 0.125 MG PO TABS
0.1250 mg | ORAL_TABLET | Freq: Once | ORAL | Status: AC
  Administered 2023-08-12: 0.125 mg via ORAL
  Filled 2023-08-12: qty 1

## 2023-08-12 MED ORDER — OMEPRAZOLE 20 MG PO CPDR
20.0000 mg | DELAYED_RELEASE_CAPSULE | Freq: Every day | ORAL | 0 refills | Status: DC
Start: 2023-08-12 — End: 2024-03-04

## 2023-08-12 MED ORDER — SODIUM CHLORIDE 0.9 % IV BOLUS
1000.0000 mL | Freq: Once | INTRAVENOUS | Status: AC
  Administered 2023-08-12: 1000 mL via INTRAVENOUS

## 2023-08-12 MED ORDER — HYDROMORPHONE HCL 1 MG/ML IJ SOLN
0.5000 mg | Freq: Once | INTRAMUSCULAR | Status: AC
  Administered 2023-08-12: 0.5 mg via INTRAVENOUS
  Filled 2023-08-12: qty 1

## 2023-08-12 MED ORDER — ONDANSETRON HCL 4 MG/2ML IJ SOLN
4.0000 mg | Freq: Once | INTRAMUSCULAR | Status: AC
  Administered 2023-08-12: 4 mg via INTRAVENOUS
  Filled 2023-08-12: qty 2

## 2023-08-12 MED ORDER — IOHEXOL 300 MG/ML  SOLN
100.0000 mL | Freq: Once | INTRAMUSCULAR | Status: AC | PRN
  Administered 2023-08-12: 100 mL via INTRAVENOUS

## 2023-08-12 MED ORDER — ALUM & MAG HYDROXIDE-SIMETH 200-200-20 MG/5ML PO SUSP
30.0000 mL | Freq: Once | ORAL | Status: AC
  Administered 2023-08-12: 30 mL via ORAL
  Filled 2023-08-12: qty 30

## 2023-08-12 MED ORDER — OXYCODONE-ACETAMINOPHEN 5-325 MG PO TABS: 1.0000 | ORAL_TABLET | Freq: Four times a day (QID) | ORAL | 0 refills | Status: DC | PRN

## 2023-08-12 MED ORDER — MORPHINE SULFATE (PF) 4 MG/ML IV SOLN
4.0000 mg | Freq: Once | INTRAVENOUS | Status: AC
  Administered 2023-08-12: 4 mg via INTRAVENOUS
  Filled 2023-08-12: qty 1

## 2023-08-12 MED ORDER — HYOSCYAMINE SULFATE 0.125 MG SL SUBL: 0.1250 mg | SUBLINGUAL_TABLET | SUBLINGUAL | 0 refills | Status: DC | PRN

## 2023-08-12 MED ORDER — ONDANSETRON 4 MG PO TBDP: 4.0000 mg | ORAL_TABLET | Freq: Three times a day (TID) | ORAL | 0 refills | Status: AC | PRN

## 2023-08-12 NOTE — Discharge Instructions (Addendum)
As we discussed, your workup in the ER today was reassuring for acute findings.  Laboratory evaluation and CT imaging did not reveal any specific emergent concerns.  There was a few enlarged lymph nodes in your abdominal area that could be contributing to your discomfort. I have given you a few medications to go home with for symptomatic relief.  The first is Levsin which is an antispasmodic for you to take as prescribed as needed for stomach cramps.  The second is Prilosec which she can take as prescribed every day 30 minutes prior to the largest meal of the day.  The third is Zofran which she can take for any residual nausea or vomiting.  I have also given you a prescription for Percocet which is a narcotic pain medication for you to take as prescribed as needed for severe pain only.  Do not drive or operate heavy machinery while taking this medication as it can be sedating.  It is very important that you schedule a close follow-up appointment with your primary doctor to have repeat evaluation and management of your symptoms as well as repeat imaging to ensure no progression of the findings seen on your CT.  Return if development of any new or worsening symptoms.

## 2023-08-12 NOTE — ED Notes (Signed)
RN reminded patient that urine sample was still needed, clear understanding voiced by patient

## 2023-08-12 NOTE — ED Provider Notes (Signed)
[place] [facility]PARTMENT AT [facility] Provider Note   CSN: [id number] Arrival date & time: 08/12/23  5621     History  Chief Complaint  Patient presents with   Back Pain    Lauren Bradford is a 47 y.o. female.  Patient with history of diabetes and hypertension presents today with complaints of abdominal pain. She states that same woke her from rest around 1 am this morning and has been persistent since.  She went to sleep in good health. Notes associated nausea and vomiting. No diarrhea. Pain is RUQ in nature as well as the right flank. No history of similar symptoms previously.  History of C-section and tubal ligation, no other history of abdominal surgeries.  No urinary symptoms.  No fevers or chills. No chest pain or shortness of breath, leg pain, or leg swelling.  The history is provided by the patient. No language interpreter was used.  Back Pain Associated symptoms: abdominal pain        Home Medications Prior to Admission medications   Medication Sig Start Date End Date Taking? Authorizing Provider  acetaZOLAMIDE (DIAMOX) 250 MG tablet Take 500 mg by mouth daily.  07/10/15   [provider]  albuterol (PROVENTIL HFA;VENTOLIN HFA) 108 (90 BASE) MCG/ACT inhaler Inhale 2 puffs into the lungs every 6 (six) hours as needed for wheezing or shortness of breath.     [provider]  brimonidine (ALPHAGAN P) 0.1 % SOLN Apply 1 drop to eye 3 (three) times daily.  09/12/14   [provider]  cyclobenzaprine (FLEXERIL) 10 MG tablet Take 1 tablet (10 mg total) by mouth 2 (two) times daily as needed for muscle spasms. 03/13/20   Janeece Fitting, MD  diclofenac Sodium (VOLTAREN) 1 % GEL Apply 4 g topically 4 (four) times daily. 05/04/21   Melene Plan, DO  dicyclomine (BENTYL) 20 MG tablet Take 1 tablet (20 mg total) by mouth every 8 (eight) hours as needed for spasms (abdominal spasm/pain). 09/25/19   Raeford Razor, MD  Difluprednate 0.05 % EMUL  Place 1 drop into the right eye 2 (two) times daily.  09/21/14   [provider]  famotidine (PEPCID) 20 MG tablet Take 1 tablet (20 mg total) by mouth 2 (two) times daily. 03/22/21   Domenick Gong, MD  hydrochlorothiazide (HYDRODIURIL) 25 MG tablet Take 25 mg by mouth daily.  08/13/09   [provider]  ibuprofen (ADVIL) 600 MG tablet Take 1 tablet (600 mg total) by mouth every 6 (six) hours as needed. 03/22/21   Domenick Gong, MD  loratadine (CLARITIN) 10 MG tablet Take 1 tablet (10 mg total) by mouth daily for 5 days. 03/22/21 03/27/21  Domenick Gong, MD  omeprazole (PRILOSEC) 20 MG capsule Take 1 capsule (20 mg total) by mouth daily. 06/16/20   Palumbo, April, MD  potassium chloride SA (KLOR-CON M20) 20 MEQ tablet Take 1 tablet (20 mEq total) by mouth daily. 09/22/16   Loleta Rose, MD  ROCKLATAN 0.02-0.005 % SOLN Apply to eye. 12/30/20   [provider]  traMADol (ULTRAM) 50 MG tablet Take 50 mg by mouth 4 (four) times daily as needed for moderate pain.  07/07/19   [provider]  TRESIBA FLEXTOUCH 100 UNIT/ML FlexTouch Pen Inject 60 Units into the skin at bedtime. 11/22/20   [provider]      Allergies    Prednisone, Latex, and Yeast-derived drug products    Review of Systems   Review of Systems  Gastrointestinal:  Positive for abdominal pain.  All other systems reviewed and are negative.   Physical Exam Updated Vital Signs BP (!) 189/103   Pulse 87   Temp 98.4 F (36.9 C) (Oral)   Resp 18   SpO2 98%  Physical Exam Vitals and nursing note reviewed.  Constitutional:      General: She is not in acute distress.    Appearance: Normal appearance. She is obese. She is not ill-appearing, toxic-appearing or diaphoretic.  HENT:     Head: Normocephalic and atraumatic.  Cardiovascular:     Rate and Rhythm: Normal rate.  Pulmonary:     Effort: Pulmonary effort is normal. No respiratory distress.  Abdominal:     General: Abdomen is  flat.     Palpations: Abdomen is soft.     Tenderness: There is generalized abdominal tenderness and tenderness in the right upper quadrant. There is right CVA tenderness.  Musculoskeletal:        General: Normal range of motion.     Cervical back: Normal range of motion.  Skin:    General: Skin is warm and dry.  Neurological:     General: No focal deficit present.     Mental Status: She is alert.  Psychiatric:        Mood and Affect: Mood normal.        Behavior: Behavior normal.     ED Results / Procedures / Treatments   Labs (all labs ordered are listed, but only abnormal results are displayed) Labs Reviewed  COMPREHENSIVE METABOLIC PANEL - Abnormal; Notable for the following components:      Result Value   Sodium 131 (*)    Glucose, Bld 260 (*)    All other components within normal limits  CBC WITH DIFFERENTIAL/PLATELET - Abnormal; Notable for the following components:   HCT 35.8 (*)    All other components within normal limits  HCG, SERUM, QUALITATIVE  URINALYSIS, W/ REFLEX TO CULTURE (INFECTION SUSPECTED)    EKG None  Radiology CT ABDOMEN PELVIS W CONTRAST  Result Date: 08/12/2023 CLINICAL DATA:  Low back pain EXAM: CT ABDOMEN AND PELVIS WITH CONTRAST TECHNIQUE: Multidetector CT imaging of the abdomen and pelvis was performed using the standard protocol following bolus administration of intravenous contrast. RADIATION DOSE REDUCTION: This exam was performed according to the departmental dose-optimization program which includes automated exposure control, adjustment of the mA and/or kV according to patient size and/or use of iterative reconstruction technique. CONTRAST:  OMNIPAQUE IOHEXOL 300 MG/ML  SOLN COMPARISON:  CT 03/13/2020 FINDINGS: Lower chest: No pleural or pericardial effusion. Patchy subsegmental atelectasis in the lung bases. Hepatobiliary: No focal liver abnormality is seen. No gallstones, gallbladder wall thickening, or biliary dilatation. Pancreas:  2.8 cm simple appearing cystic lesion abutting the uncinate process, new since previous. No ductal dilatation or regional inflammatory changes. Spleen: Normal in size without focal abnormality. Adrenals/Urinary Tract: No adrenal mass. Symmetric renal enhancement without focal lesion, urolithiasis, or hydronephrosis. Urinary bladder incompletely distended. Stomach/Bowel: Stomach and small bowel nondistended, unremarkable. Normal appendix. The colon is partially distended, with scattered distal descending and proximal sigmoid diverticula; no adjacent inflammatory change. Vascular/Lymphatic: Scattered aortoiliac calcified plaque without aneurysm or evident stenosis. Portal vein and IVC patent. New low-attenuation retroperitoneal and retrocrural adenopathy including 1.5 cm right common iliac node (Im60,Se2) , 12 mm aortocaval node image 47, and retrocrural nodes up to 1.3 cm image 32. There are regional mild retroperitoneal inflammatory/edematous changes. Reproductive: Uterus and bilateral adnexa are unremarkable. Tubal ligation clips. Other:  No ascites.  No free air. Musculoskeletal: Vertebral endplate spurring at multiple levels in the lower thoracic spine. Lumbar facet DJD most marked L4-5. Mild hip DJD. IMPRESSION: 1. New low-attenuation retroperitoneal and retrocrural adenopathy, with regional mild inflammatory/edematous changes. Findings are nonspecific, but could reflect lymphoma or other lymphoproliferative process. 2. New 2.8 cm simple appearing cystic lesion abutting the uncinate process of the pancreas. Recommend follow-up pre and post contrast CT or MR in 6 months. 3.  Aortic Atherosclerosis (ICD10-I70.0). Electronically Signed   By: Corlis Leak M.D.   On: 08/12/2023 09:59    Procedures Procedures    Medications Ordered in ED Medications  morphine (PF) 4 MG/ML injection 4 mg (4 mg Intravenous Given 08/12/23 0848)  ondansetron (ZOFRAN) injection 4 mg (4 mg Intravenous Given 08/12/23 0848)  iohexol  (OMNIPAQUE) 300 MG/ML solution 100 mL (100 mLs Intravenous Contrast Given 08/12/23 0934)  HYDROmorphone (DILAUDID) injection 0.5 mg (0.5 mg Intravenous Given 08/12/23 1610)    ED Course/ Medical Decision Making/ A&P                                 Medical Decision Making Amount and/or Complexity of Data Reviewed Labs: ordered. Radiology: ordered.  Risk OTC drugs. Prescription drug management.   This patient is a 47 y.o. female who presents to the ED for concern of abdominal pain, this involves an extensive number of treatment options, and is a complaint that carries with it a high risk of complications and morbidity. The emergent differential diagnosis prior to evaluation includes, but is not limited to, Glabladder disease, PUD, Acute Hepatitis, Pancreatitis, pyelonephritis, Pneumonia, Lower lobe PE/Infarct, Kidney stone, GERD, retrocecal appendicitis, Fitz-Hugh-Curtis syndrome, AAA, MI, Zoster.    This is not an exhaustive differential.   Past Medical History / Co-morbidities / Social History:  has a past medical history of Advanced stage glaucoma, Asthma, Glaucoma, Hypertension, Legal blindness, Ovarian cyst (ER), and Type 2 diabetes mellitus (HCC).  Additional history: Chart reviewed.   Physical Exam: Physical exam performed. The pertinent findings include: RUQ and right flank pain  Lab Tests: I ordered, and personally interpreted labs.  The pertinent results include:  Na 260, Na 131, UA noninfectious. Liver enzymes WNL   Imaging Studies: I ordered imaging studies including CT abdomen pelvis. I independently visualized and interpreted imaging which showed   1. New low-attenuation retroperitoneal and retrocrural adenopathy, with regional mild inflammatory/edematous changes. Findings are nonspecific, but could reflect lymphoma or other lymphoproliferative process. 2. New 2.8 cm simple appearing cystic lesion abutting the uncinate process of the pancreas. Recommend follow-up  pre and post contrast CT or MR in 6 months.  I agree with the radiologist interpretation.   Medications: I ordered medication including dilaudid, morphine, zofran  for pain, nausea/vomiting. Levsin and GI cocktail. Reevaluation of the patient after these medicines showed that the patient improved. I have reviewed the patients home medicines and have made adjustments as needed.  Consultations Obtained: I requested consultation with the oncology on call Dr. Yevette Edwards,  and discussed lab and imaging findings as well as pertinent plan - they recommend: they reviewed imaging and feel lymph node enlargement is likely inflammatory rather than malignancy. Close pcp follow-up needed for re-evaluation and to facilitate follow-up imaging. Also recommends ESR, CRP, and LDH to help further evaluation outpatient. No oncology follow-up indicated    Disposition: After consideration of the diagnostic results and the patients response to treatment, I feel that emergency  department workup does not suggest an emergent condition requiring admission or immediate intervention beyond what has been performed at this time. The plan is: discharge with close outpatient follow-up and return precautions. After medication and fluids, patient is able to eat and drink without any residual nausea or vomiting.  Given pain in the right upper quadrant, I did consider further evaluation with a RUQ [place], however no transaminitis seen on labs and negative Murphy sign. She is still having pain requiring regular IV narcotics.  Given this with the findings on her CT, will send for a few doses of Percocet to help with her symptoms.  PDMP reviewed.  Patient advised not to drive or operate heavy machinery while taking this medication.  Recommend close outpatient follow-up.  Have also discussed the findings on her CT scan as well as the recommendations for repeat CT in 3 to 6 months.  She does have a PCP to follow-up closely with and would prefer to  manage her symptoms at home.  Will also send for Zofran for residual nausea and vomiting. Will also trial prilosec and levsin for symptomatic relief. Patients ESR, CRP, and LDH ordered per oncology's request to help with patients follow-up outpatient. No indication for patient to remain in the ER for these to result.  I have discussed with her thoroughly to be sure to follow-up with these results with her primary doctor at her next appointment. Evaluation and diagnostic testing in the emergency department does not suggest an emergent condition requiring admission or immediate intervention beyond what has been performed at this time.  Plan for discharge with close PCP follow-up.  Patient is understanding and amenable with plan, educated on red flag symptoms that would prompt immediate return.  Patient discharged in stable condition.   I discussed this case with my attending physician Dr. Rush Landmark who cosigned this note including patient's presenting symptoms, physical exam, and planned diagnostics and interventions. Attending physician stated agreement with plan or made changes to plan which were implemented.    Final Clinical Impression(s) / ED Diagnoses Final diagnoses:  Retroperitoneal lymphadenopathy  Right upper quadrant abdominal pain  Nausea and vomiting, unspecified vomiting type  Pancreatic cyst    Rx / DC Orders ED Discharge Orders          Ordered    ondansetron (ZOFRAN-ODT) 4 MG disintegrating tablet  Every 8 hours PRN        08/12/23 1413    oxyCODONE-acetaminophen (PERCOCET/ROXICET) 5-325 MG tablet  Every 6 hours PRN        08/12/23 1413    hyoscyamine (LEVSIN/SL) 0.125 MG SL tablet  Every 4 hours PRN        08/12/23 1413    omeprazole (PRILOSEC) 20 MG capsule  Daily        08/12/23 1413          An After Visit Summary was printed and given to the patient.     Vear Clock 08/12/23 1415    Tegeler, Canary Brim, MD 08/12/23 1536

## 2023-08-12 NOTE — ED Notes (Signed)
Patient transported to CT 

## 2023-08-12 NOTE — ED Triage Notes (Signed)
Patient here from home reporting lower back pain with n/v that started around 1 am this morning. Reports no history of kidney stones. Reports pain radiating to the middle of back down to lower back.

## 2023-08-16 DIAGNOSIS — M5459 Other low back pain: Secondary | ICD-10-CM | POA: Diagnosis not present

## 2023-08-16 DIAGNOSIS — K5900 Constipation, unspecified: Secondary | ICD-10-CM | POA: Diagnosis not present

## 2023-08-16 DIAGNOSIS — E119 Type 2 diabetes mellitus without complications: Secondary | ICD-10-CM | POA: Diagnosis not present

## 2023-08-16 DIAGNOSIS — I10 Essential (primary) hypertension: Secondary | ICD-10-CM | POA: Diagnosis not present

## 2023-08-16 DIAGNOSIS — K219 Gastro-esophageal reflux disease without esophagitis: Secondary | ICD-10-CM | POA: Diagnosis not present

## 2023-08-17 ENCOUNTER — Ambulatory Visit
Admission: RE | Admit: 2023-08-17 | Discharge: 2023-08-17 | Disposition: A | Discharge status: Home / Self Care | Payer: 59 | Source: Ambulatory Visit | Attending: Family Medicine | Admitting: Family Medicine

## 2023-08-17 ENCOUNTER — Other Ambulatory Visit: Payer: Self-pay | Admitting: Family Medicine

## 2023-08-17 DIAGNOSIS — R079 Chest pain, unspecified: Secondary | ICD-10-CM

## 2023-11-06 DIAGNOSIS — H401133 Primary open-angle glaucoma, bilateral, severe stage: Secondary | ICD-10-CM | POA: Diagnosis not present

## 2023-11-06 DIAGNOSIS — Z961 Presence of intraocular lens: Secondary | ICD-10-CM | POA: Diagnosis not present

## 2023-11-06 DIAGNOSIS — H59031 Cystoid macular edema following cataract surgery, right eye: Secondary | ICD-10-CM | POA: Diagnosis not present

## 2023-11-06 DIAGNOSIS — T868409 Corneal transplant rejection, unspecified eye: Secondary | ICD-10-CM | POA: Diagnosis not present

## 2023-11-06 DIAGNOSIS — H35371 Puckering of macula, right eye: Secondary | ICD-10-CM | POA: Diagnosis not present

## 2023-11-06 DIAGNOSIS — Z8669 Personal history of other diseases of the nervous system and sense organs: Secondary | ICD-10-CM | POA: Diagnosis not present

## 2023-11-06 DIAGNOSIS — Z9889 Other specified postprocedural states: Secondary | ICD-10-CM | POA: Diagnosis not present

## 2024-01-07 DIAGNOSIS — E782 Mixed hyperlipidemia: Secondary | ICD-10-CM | POA: Diagnosis not present

## 2024-01-07 DIAGNOSIS — E119 Type 2 diabetes mellitus without complications: Secondary | ICD-10-CM | POA: Diagnosis not present

## 2024-01-07 DIAGNOSIS — Z0000 Encounter for general adult medical examination without abnormal findings: Secondary | ICD-10-CM | POA: Diagnosis not present

## 2024-01-07 DIAGNOSIS — E876 Hypokalemia: Secondary | ICD-10-CM | POA: Diagnosis not present

## 2024-02-05 DIAGNOSIS — Z947 Corneal transplant status: Secondary | ICD-10-CM | POA: Diagnosis not present

## 2024-03-03 ENCOUNTER — Other Ambulatory Visit: Payer: Self-pay

## 2024-03-03 ENCOUNTER — Emergency Department (HOSPITAL_COMMUNITY)

## 2024-03-03 ENCOUNTER — Observation Stay (HOSPITAL_COMMUNITY)
Admission: EM | Admit: 2024-03-03 | Discharge: 2024-03-07 | Disposition: A | Discharge status: Home / Self Care | Attending: Internal Medicine | Admitting: Internal Medicine

## 2024-03-03 ENCOUNTER — Encounter (HOSPITAL_COMMUNITY): Payer: Self-pay

## 2024-03-03 DIAGNOSIS — N949 Unspecified condition associated with female genital organs and menstrual cycle: Secondary | ICD-10-CM

## 2024-03-03 DIAGNOSIS — J45909 Unspecified asthma, uncomplicated: Secondary | ICD-10-CM | POA: Insufficient documentation

## 2024-03-03 DIAGNOSIS — J4520 Mild intermittent asthma, uncomplicated: Secondary | ICD-10-CM

## 2024-03-03 DIAGNOSIS — K869 Disease of pancreas, unspecified: Secondary | ICD-10-CM | POA: Diagnosis present

## 2024-03-03 DIAGNOSIS — Z91040 Latex allergy status: Secondary | ICD-10-CM | POA: Diagnosis not present

## 2024-03-03 DIAGNOSIS — R1013 Epigastric pain: Secondary | ICD-10-CM

## 2024-03-03 DIAGNOSIS — N83201 Unspecified ovarian cyst, right side: Secondary | ICD-10-CM | POA: Diagnosis present

## 2024-03-03 DIAGNOSIS — Z87891 Personal history of nicotine dependence: Secondary | ICD-10-CM | POA: Insufficient documentation

## 2024-03-03 DIAGNOSIS — E119 Type 2 diabetes mellitus without complications: Secondary | ICD-10-CM

## 2024-03-03 DIAGNOSIS — I700 Atherosclerosis of aorta: Secondary | ICD-10-CM | POA: Diagnosis not present

## 2024-03-03 DIAGNOSIS — I10 Essential (primary) hypertension: Secondary | ICD-10-CM | POA: Diagnosis not present

## 2024-03-03 DIAGNOSIS — Z79899 Other long term (current) drug therapy: Secondary | ICD-10-CM | POA: Insufficient documentation

## 2024-03-03 DIAGNOSIS — H409 Unspecified glaucoma: Secondary | ICD-10-CM | POA: Insufficient documentation

## 2024-03-03 DIAGNOSIS — N83291 Other ovarian cyst, right side: Secondary | ICD-10-CM | POA: Diagnosis not present

## 2024-03-03 DIAGNOSIS — K862 Cyst of pancreas: Principal | ICD-10-CM

## 2024-03-03 DIAGNOSIS — K8050 Calculus of bile duct without cholangitis or cholecystitis without obstruction: Secondary | ICD-10-CM

## 2024-03-03 DIAGNOSIS — K810 Acute cholecystitis: Secondary | ICD-10-CM | POA: Diagnosis present

## 2024-03-03 DIAGNOSIS — I160 Hypertensive urgency: Secondary | ICD-10-CM | POA: Diagnosis not present

## 2024-03-03 DIAGNOSIS — K8001 Calculus of gallbladder with acute cholecystitis with obstruction: Secondary | ICD-10-CM | POA: Diagnosis not present

## 2024-03-03 DIAGNOSIS — R109 Unspecified abdominal pain: Secondary | ICD-10-CM | POA: Diagnosis present

## 2024-03-03 LAB — CBC WITH DIFFERENTIAL/PLATELET
Abs Immature Granulocytes: 0.07 10*3/uL (ref 0.00–0.07)
Basophils Absolute: 0 10*3/uL (ref 0.0–0.1)
Basophils Relative: 1 %
Eosinophils Absolute: 0.1 10*3/uL (ref 0.0–0.5)
Eosinophils Relative: 1 %
HCT: 35.9 % — ABNORMAL LOW (ref 36.0–46.0)
Hemoglobin: 11.7 g/dL — ABNORMAL LOW (ref 12.0–15.0)
Immature Granulocytes: 1 %
Lymphocytes Relative: 21 %
Lymphs Abs: 1.3 10*3/uL (ref 0.7–4.0)
MCH: 28.4 pg (ref 26.0–34.0)
MCHC: 32.6 g/dL (ref 30.0–36.0)
MCV: 87.1 fL (ref 80.0–100.0)
Monocytes Absolute: 0.4 10*3/uL (ref 0.1–1.0)
Monocytes Relative: 7 %
Neutro Abs: 4.4 10*3/uL (ref 1.7–7.7)
Neutrophils Relative %: 69 %
Platelets: 312 10*3/uL (ref 150–400)
RBC: 4.12 MIL/uL (ref 3.87–5.11)
RDW: 12.9 % (ref 11.5–15.5)
WBC: 6.2 10*3/uL (ref 4.0–10.5)
nRBC: 0 % (ref 0.0–0.2)

## 2024-03-03 LAB — COMPREHENSIVE METABOLIC PANEL WITH GFR
ALT: 18 U/L (ref 0–44)
AST: 15 U/L (ref 15–41)
Albumin: 3.8 g/dL (ref 3.5–5.0)
Alkaline Phosphatase: 121 U/L (ref 38–126)
Anion gap: 10 (ref 5–15)
BUN: 12 mg/dL (ref 6–20)
CO2: 26 mmol/L (ref 22–32)
Calcium: 9.2 mg/dL (ref 8.9–10.3)
Chloride: 96 mmol/L — ABNORMAL LOW (ref 98–111)
Creatinine, Ser: 0.84 mg/dL (ref 0.44–1.00)
GFR, Estimated: >60 mL/min (ref >60)
Glucose, Bld: 242 mg/dL — ABNORMAL HIGH (ref 70–99)
Potassium: 3.5 mmol/L (ref 3.5–5.1)
Sodium: 132 mmol/L — ABNORMAL LOW (ref 135–145)
Total Bilirubin: 0.7 mg/dL (ref 0.0–1.2)
Total Protein: 7.7 g/dL (ref 6.5–8.1)

## 2024-03-03 LAB — GLUCOSE, CAPILLARY
Glucose-Capillary: 193 mg/dL — ABNORMAL HIGH (ref 70–99)
Glucose-Capillary: 210 mg/dL — ABNORMAL HIGH (ref 70–99)

## 2024-03-03 LAB — URINALYSIS, ROUTINE W REFLEX MICROSCOPIC
Bilirubin Urine: NEGATIVE
Glucose, UA: >=500 mg/dL — AB
Hgb urine dipstick: NEGATIVE
Ketones, ur: NEGATIVE mg/dL
Leukocytes,Ua: NEGATIVE
Nitrite: POSITIVE — AB
Protein, ur: NEGATIVE mg/dL
Specific Gravity, Urine: 1.028 (ref 1.005–1.030)
pH: 7 (ref 5.0–8.0)

## 2024-03-03 LAB — HCG, SERUM, QUALITATIVE: Preg, Serum: NEGATIVE

## 2024-03-03 LAB — TROPONIN I (HIGH SENSITIVITY): Troponin I (High Sensitivity): 4 ng/L (ref <18)

## 2024-03-03 LAB — LIPASE, BLOOD: Lipase: 34 U/L (ref 11–51)

## 2024-03-03 MED ORDER — IOHEXOL 300 MG/ML  SOLN
100.0000 mL | Freq: Once | INTRAMUSCULAR | Status: AC | PRN
  Administered 2024-03-03: 100 mL via INTRAVENOUS

## 2024-03-03 MED ORDER — LACTATED RINGERS IV SOLN: INTRAVENOUS | Status: DC

## 2024-03-03 MED ORDER — HYDROMORPHONE HCL 1 MG/ML IJ SOLN
0.5000 mg | INTRAMUSCULAR | Status: AC | PRN
  Administered 2024-03-03: 1 mg via INTRAVENOUS
  Filled 2024-03-03: qty 1

## 2024-03-03 MED ORDER — ONDANSETRON HCL 4 MG PO TABS: 4.0000 mg | ORAL_TABLET | Freq: Four times a day (QID) | ORAL | Status: DC | PRN

## 2024-03-03 MED ORDER — POLYETHYLENE GLYCOL 3350 17 G PO PACK: 17.0000 g | PACK | Freq: Every day | ORAL | Status: DC | PRN

## 2024-03-03 MED ORDER — LABETALOL HCL 5 MG/ML IV SOLN: 10.0000 mg | INTRAVENOUS | Status: DC | PRN

## 2024-03-03 MED ORDER — ONDANSETRON HCL 4 MG/2ML IJ SOLN
4.0000 mg | Freq: Four times a day (QID) | INTRAMUSCULAR | Status: DC | PRN
  Administered 2024-03-06: 4 mg via INTRAVENOUS
  Filled 2024-03-03: qty 2

## 2024-03-03 MED ORDER — INSULIN ASPART 100 UNIT/ML IJ SOLN
0.0000 [IU] | INTRAMUSCULAR | Status: DC
  Administered 2024-03-03: 2 [IU] via SUBCUTANEOUS
  Administered 2024-03-04: 1 [IU] via SUBCUTANEOUS
  Administered 2024-03-04: 2 [IU] via SUBCUTANEOUS
  Administered 2024-03-04: 1 [IU] via SUBCUTANEOUS
  Administered 2024-03-04: 2 [IU] via SUBCUTANEOUS
  Administered 2024-03-04: 1 [IU] via SUBCUTANEOUS
  Administered 2024-03-05: 2 [IU] via SUBCUTANEOUS
  Administered 2024-03-05 (×2): 1 [IU] via SUBCUTANEOUS
  Administered 2024-03-06 (×2): 2 [IU] via SUBCUTANEOUS
  Administered 2024-03-06 – 2024-03-07 (×4): 1 [IU] via SUBCUTANEOUS

## 2024-03-03 MED ORDER — SODIUM CHLORIDE 0.9% FLUSH
3.0000 mL | Freq: Two times a day (BID) | INTRAVENOUS | Status: DC
  Administered 2024-03-04 – 2024-03-07 (×4): 3 mL via INTRAVENOUS

## 2024-03-03 MED ORDER — ALBUTEROL SULFATE (2.5 MG/3ML) 0.083% IN NEBU: 2.5000 mg | INHALATION_SOLUTION | Freq: Four times a day (QID) | RESPIRATORY_TRACT | Status: DC | PRN

## 2024-03-03 MED ORDER — ACETAMINOPHEN 325 MG PO TABS
650.0000 mg | ORAL_TABLET | Freq: Four times a day (QID) | ORAL | Status: DC | PRN
  Filled 2024-03-03: qty 2

## 2024-03-03 MED ORDER — ONDANSETRON HCL 4 MG/2ML IJ SOLN
4.0000 mg | Freq: Once | INTRAMUSCULAR | Status: AC
  Administered 2024-03-03: 4 mg via INTRAVENOUS
  Filled 2024-03-03: qty 2

## 2024-03-03 MED ORDER — HYDROMORPHONE HCL 1 MG/ML IJ SOLN
0.5000 mg | INTRAMUSCULAR | Status: DC | PRN
  Administered 2024-03-03: 0.5 mg via INTRAVENOUS
  Filled 2024-03-03: qty 1

## 2024-03-03 MED ORDER — ACETAMINOPHEN 650 MG RE SUPP: 650.0000 mg | Freq: Four times a day (QID) | RECTAL | Status: DC | PRN

## 2024-03-03 MED ORDER — HYDROMORPHONE HCL 1 MG/ML IJ SOLN
0.5000 mg | INTRAMUSCULAR | Status: AC | PRN
  Administered 2024-03-03 (×2): 0.5 mg via INTRAVENOUS
  Filled 2024-03-03 (×2): qty 1

## 2024-03-03 MED ORDER — PANTOPRAZOLE SODIUM 40 MG PO TBEC
40.0000 mg | DELAYED_RELEASE_TABLET | Freq: Every day | ORAL | Status: DC
  Administered 2024-03-05 – 2024-03-07 (×3): 40 mg via ORAL
  Filled 2024-03-03 (×4): qty 1

## 2024-03-03 MED ORDER — OXYCODONE HCL 5 MG PO TABS
5.0000 mg | ORAL_TABLET | ORAL | Status: DC | PRN
  Administered 2024-03-04 – 2024-03-05 (×3): 5 mg via ORAL
  Filled 2024-03-03 (×3): qty 1

## 2024-03-03 NOTE — ED Triage Notes (Signed)
 Pt BIBA from home for abdominal pain that radiates to the back 10/10. Nausea, no emesis.  Last BM was 8pm last evening  Pt denies urinary frequency or discomfort.  Ptt endorses hx of reflux. Pt is legally blind

## 2024-03-03 NOTE — H&P (Signed)
 History and Physical    SHEKETA ENDE ZOX:[id number] DOB: 1976-02-03 DOA: 03/03/2024  PCP: Danella Dunn, MD   Patient coming from: Home  Chief Complaint: Upper abdominal pain, nausea   HPI: IVANKA KIRSHNER is a 48 y.o. female with medical history significant for asthma, hypertension, type 2 diabetes mellitus, and blindness who presents with upper abdominal pain and nausea.  Patient ate dinner at approximately 6:30 PM last night and then developed epigastric pain with nausea roughly 3 hours later.  Pain and nausea have been constant since then and she had 1 episode of vomiting upon arrival to the ED.  She is unable to identify any alleviating or exacerbating factors.  She denies diarrhea or urinary symptoms.  ED Course: Upon arrival to the ED, patient is found to be afebrile and saturating mid 90s on room air with normal HR and elevated BP.  Labs are most notable for normal creatinine, glucose 242, normal WBC, normal lipase, normal LFTs, and normal troponin.  Right adnexal lesion, slightly increased adenopathy, stable pancreatic lesion, and subtle pericholecystic edema was noted on CT.  Ultrasound demonstrates distended gallbladder with stones, no ovarian torsion, and complex right adnexal cyst.   OB/GYN (Dr. Racheal Buddle) was consulted by the ED physician and recommended checking ROMA and close outpatient follow-up.  Surgery (Dr. Afton Horse) was consulted by the ED physician.  Patient was treated with Zofran  and Dilaudid  in the ED.  Review of Systems:  All other systems reviewed and apart from HPI, are negative.  Past Medical History:  Diagnosis Date   Advanced stage glaucoma    limited vision   Asthma    Glaucoma    Hypertension    Legal blindness    Ovarian cyst ER   right   Type 2 diabetes mellitus (HCC)     Past Surgical History:  Procedure Laterality Date   CESAREAN SECTION  2002   EYE SURGERY     several   TUBAL LIGATION  2003    Social History:   reports that she has quit  smoking. She has never used smokeless tobacco. She reports that she does not drink alcohol and does not use drugs.  Allergies  Allergen Reactions   Prednisone Hives, Itching and Swelling   Latex Rash   Yeast-Derived Drug Products Rash    Wheezing     Family History  Problem Relation Age of Onset   Diabetes Mother    Hyperlipidemia Mother    Hypertension Mother    Diabetes Father    Hypertension Father    Stroke Father    Cancer Father    Cancer Maternal Grandfather        lung   Colon cancer Neg Hx    Colon polyps Neg Hx    Esophageal cancer Neg Hx    Rectal cancer Neg Hx    Stomach cancer Neg Hx      Prior to Admission medications   Medication Sig Start Date End Date Taking? Authorizing Provider  acetaZOLAMIDE  (DIAMOX ) 250 MG tablet Take 500 mg by mouth daily.  07/10/15   [provider]  albuterol  (PROVENTIL  HFA;VENTOLIN  HFA) 108 (90 BASE) MCG/ACT inhaler Inhale 2 puffs into the lungs every 6 (six) hours as needed for wheezing or shortness of breath.     [provider]  brimonidine  (ALPHAGAN  P) 0.1 % SOLN Apply 1 drop to eye 3 (three) times daily.  09/12/14   [provider]  cyclobenzaprine  (FLEXERIL ) 10 MG tablet Take 1 tablet (10 mg  total) by mouth 2 (two) times daily as needed for muscle spasms. 03/13/20   Landsee, Adam, MD  diclofenac  Sodium (VOLTAREN ) 1 % GEL Apply 4 g topically 4 (four) times daily. 05/04/21   Floyd, Dan, DO  Difluprednate  0.05 % EMUL Place 1 drop into the right eye 2 (two) times daily.  09/21/14   [provider]  famotidine  (PEPCID ) 20 MG tablet Take 1 tablet (20 mg total) by mouth 2 (two) times daily. 03/22/21   Mortenson, Ashley, MD  hydrochlorothiazide  (HYDRODIURIL ) 25 MG tablet Take 25 mg by mouth daily.  08/13/09   [provider]  hyoscyamine  (LEVSIN/SL) 0.125 MG SL tablet Place 1 tablet (0.125 mg total) under the tongue every 4 (four) hours as needed. 08/12/23   Smoot, Genevive Ket, PA-C  ibuprofen  (ADVIL )  600 MG tablet Take 1 tablet (600 mg total) by mouth every 6 (six) hours as needed. 03/22/21   Mortenson, Ashley, MD  loratadine  (CLARITIN ) 10 MG tablet Take 1 tablet (10 mg total) by mouth daily for 5 days. 03/22/21 03/27/21  Ethlyn Herd, MD  omeprazole  (PRILOSEC) 20 MG capsule Take 1 capsule (20 mg total) by mouth daily. 08/12/23   Smoot, Genevive Ket, PA-C  ondansetron  (ZOFRAN -ODT) 4 MG disintegrating tablet Take 1 tablet (4 mg total) by mouth every 8 (eight) hours as needed for nausea or vomiting. 08/12/23   Smoot, Genevive Ket, PA-C  oxyCODONE -acetaminophen  (PERCOCET/ROXICET) 5-325 MG tablet Take 1 tablet by mouth every 6 (six) hours as needed for severe pain (pain score 7-10). 08/12/23   Smoot, Genevive Ket, PA-C  potassium chloride  SA (KLOR-CON  M20) 20 MEQ tablet Take 1 tablet (20 mEq total) by mouth daily. 09/22/16   Lynnda Sas, MD  ROCKLATAN 0.02-0.005 % SOLN Apply to eye. 12/30/20   [provider]  traMADol  (ULTRAM ) 50 MG tablet Take 50 mg by mouth 4 (four) times daily as needed for moderate pain.  07/07/19   [provider]  TRESIBA FLEXTOUCH 100 UNIT/ML FlexTouch Pen Inject 60 Units into the skin at bedtime. 11/22/20   [provider]    Physical Exam: Vitals:   03/03/24 1015 03/03/24 1030 03/03/24 1345 03/03/24 1649  BP: (!) 160/94 (!) 194/114 (!) 145/71 (!) 158/105  Pulse: 73 86 83 81  Resp: 13 (!) 25 (!) 24 (!) 21  Temp:   99.5 F (37.5 C) 98 F (36.7 C)  TempSrc:    Oral  SpO2: 95% 93% 97% 98%  Weight:      Height:        Constitutional: NAD, no pallor or diaphoresis   Eyes: PERTLA, lids and conjunctivae normal ENMT: Mucous membranes are moist. Posterior pharynx clear of any exudate or lesions.   Neck: supple, no masses  Respiratory: no wheezing, no crackles. No accessory muscle use.  Cardiovascular: S1 & S2 heard, regular rate and rhythm. No extremity edema.  Abdomen: Soft, tender in epigastrium, no guarding. Bowel sounds active.  Musculoskeletal: no  clubbing / cyanosis. No joint deformity upper and lower extremities.   Skin: no significant rashes, lesions, ulcers. Warm, dry, well-perfused. Neurologic: CN 2-12 grossly intact. Moving all extremities. Alert and oriented.  Psychiatric: Calm. Cooperative.    Labs and Imaging on Admission: I have personally reviewed following labs and imaging studies  CBC: Recent Labs  Lab 03/03/24 0844  WBC 6.2  NEUTROABS 4.4  HGB 11.7*  HCT 35.9*  MCV 87.1  PLT 312   Basic Metabolic Panel: Recent Labs  Lab 03/03/24 0844  NA 132*  K 3.5  CL 96*  CO2 26  GLUCOSE 242*  BUN 12  CREATININE 0.84  CALCIUM 9.2   GFR: Estimated Creatinine Clearance: 96.3 mL/min (by C-G formula based on SCr of 0.84 mg/dL). Liver Function Tests: Recent Labs  Lab 03/03/24 0844  AST 15  ALT 18  ALKPHOS 121  BILITOT 0.7  PROT 7.7  ALBUMIN 3.8   Recent Labs  Lab 03/03/24 0844  LIPASE 34   No results for input(s): "AMMONIA" in the last 168 hours. Coagulation Profile: No results for input(s): "INR", "PROTIME" in the last 168 hours. Cardiac Enzymes: No results for input(s): "CKTOTAL", "CKMB", "CKMBINDEX", "TROPONINI" in the last 168 hours. BNP (last 3 results) No results for input(s): "PROBNP" in the last 8760 hours. HbA1C: No results for input(s): "HGBA1C" in the last 72 hours. CBG: No results for input(s): "GLUCAP" in the last 168 hours. Lipid Profile: No results for input(s): "CHOL", "HDL", "LDLCALC", "TRIG", "CHOLHDL", "LDLDIRECT" in the last 72 hours. Thyroid  Function Tests: No results for input(s): "TSH", "T4TOTAL", "FREET4", "T3FREE", "THYROIDAB" in the last 72 hours. Anemia Panel: No results for input(s): "VITAMINB12", "FOLATE", "FERRITIN", "TIBC", "IRON", "RETICCTPCT" in the last 72 hours. Urine analysis:    Component Value Date/Time   COLORURINE YELLOW 03/03/2024 1120   APPEARANCEUR HAZY (A) 03/03/2024 1120   APPEARANCEUR Cloudy 02/22/2014 1557   LABSPEC 1.028 03/03/2024 1120    LABSPEC 1.021 02/22/2014 1557   PHURINE 7.0 03/03/2024 1120   GLUCOSEU >=500 (A) 03/03/2024 1120   GLUCOSEU Negative 02/22/2014 1557   HGBUR NEGATIVE 03/03/2024 1120   BILIRUBINUR NEGATIVE 03/03/2024 1120   BILIRUBINUR Negative 02/22/2014 1557   KETONESUR NEGATIVE 03/03/2024 1120   PROTEINUR NEGATIVE 03/03/2024 1120   NITRITE POSITIVE (A) 03/03/2024 1120   LEUKOCYTESUR NEGATIVE 03/03/2024 1120   LEUKOCYTESUR 2+ 02/22/2014 1557   Sepsis Labs: @LABRCNTIP (procalcitonin:4,lacticidven:4) )No results found for this or any previous visit (from the past 240 hours).   Radiological Exams on Admission: US  Pelvis Complete Result Date: 03/03/2024 CLINICAL DATA:  Right-sided abdominal and pelvic pain. Right adnexal cystic lesion on recent CT. EXAM: TRANSABDOMINAL AND TRANSVAGINAL ULTRASOUND OF PELVIS DOPPLER ULTRASOUND OF OVARIES TECHNIQUE: Both transabdominal and transvaginal ultrasound examinations of the pelvis were performed. Transabdominal technique was performed for global imaging of the pelvis including uterus, ovaries, adnexal regions, and pelvic cul-de-sac. It was necessary to proceed with endovaginal exam following the transabdominal exam to visualize the right adnexal lesion. Color and duplex Doppler ultrasound was utilized to evaluate blood flow to the ovaries. COMPARISON:  CT on 03/03/2024 FINDINGS: Uterus Measurements: 7.2 x 4.5 x 5.2 cm = volume: 89 mL. Diffusely heterogeneous myometrial echotexture is seen, without discrete myometrial masses. Endometrium Thickness: 7 mm. Right ovary Measurements: No normal ovary visualized. A complex cystic lesion is seen in the right adnexa measuring 6.6 x 4.1 x 4.8 cm. This lesion has multiple internal septations, some of which show mild thickening and internal blood flow on color and duplex Doppler. Differential diagnosis includes cystic ovarian neoplasm, hydro-pyo-salpinx, and endometrioma. Left ovary Measurements: 2.3 x 2.2 x 2.1 cm = volume: 5.5 mL. Normal  appearance/no adnexal mass. Pulsed Doppler evaluation of both ovaries demonstrates normal low-resistance arterial and venous waveforms. Other findings No abnormal free fluid. IMPRESSION: 6.6 cm complex cystic lesion in the right adnexa, with multiple mildly thickened septations but no soft tissue component. Differential diagnosis includes cystic ovarian neoplasm, however, hydrosalpinx, and endometrioma. No sonographic evidence for ovarian torsion. Electronically Signed   By: Marlyce Sine M.D.   On:  03/03/2024 13:38   US  Transvaginal Non-OB Result Date: 03/03/2024 CLINICAL DATA:  Right-sided abdominal and pelvic pain. Right adnexal cystic lesion on recent CT. EXAM: TRANSABDOMINAL AND TRANSVAGINAL ULTRASOUND OF PELVIS DOPPLER ULTRASOUND OF OVARIES TECHNIQUE: Both transabdominal and transvaginal ultrasound examinations of the pelvis were performed. Transabdominal technique was performed for global imaging of the pelvis including uterus, ovaries, adnexal regions, and pelvic cul-de-sac. It was necessary to proceed with endovaginal exam following the transabdominal exam to visualize the right adnexal lesion. Color and duplex Doppler ultrasound was utilized to evaluate blood flow to the ovaries. COMPARISON:  CT on 03/03/2024 FINDINGS: Uterus Measurements: 7.2 x 4.5 x 5.2 cm = volume: 89 mL. Diffusely heterogeneous myometrial echotexture is seen, without discrete myometrial masses. Endometrium Thickness: 7 mm. Right ovary Measurements: No normal ovary visualized. A complex cystic lesion is seen in the right adnexa measuring 6.6 x 4.1 x 4.8 cm. This lesion has multiple internal septations, some of which show mild thickening and internal blood flow on color and duplex Doppler. Differential diagnosis includes cystic ovarian neoplasm, hydro-pyo-salpinx, and endometrioma. Left ovary Measurements: 2.3 x 2.2 x 2.1 cm = volume: 5.5 mL. Normal appearance/no adnexal mass. Pulsed Doppler evaluation of both ovaries demonstrates  normal low-resistance arterial and venous waveforms. Other findings No abnormal free fluid. IMPRESSION: 6.6 cm complex cystic lesion in the right adnexa, with multiple mildly thickened septations but no soft tissue component. Differential diagnosis includes cystic ovarian neoplasm, however, hydrosalpinx, and endometrioma. No sonographic evidence for ovarian torsion. Electronically Signed   By: Marlyce Sine M.D.   On: 03/03/2024 13:38   US  Art/Ven Flow Abd Pelv Doppler Result Date: 03/03/2024 CLINICAL DATA:  Right-sided abdominal and pelvic pain. Right adnexal cystic lesion on recent CT. EXAM: TRANSABDOMINAL AND TRANSVAGINAL ULTRASOUND OF PELVIS DOPPLER ULTRASOUND OF OVARIES TECHNIQUE: Both transabdominal and transvaginal ultrasound examinations of the pelvis were performed. Transabdominal technique was performed for global imaging of the pelvis including uterus, ovaries, adnexal regions, and pelvic cul-de-sac. It was necessary to proceed with endovaginal exam following the transabdominal exam to visualize the right adnexal lesion. Color and duplex Doppler ultrasound was utilized to evaluate blood flow to the ovaries. COMPARISON:  CT on 03/03/2024 FINDINGS: Uterus Measurements: 7.2 x 4.5 x 5.2 cm = volume: 89 mL. Diffusely heterogeneous myometrial echotexture is seen, without discrete myometrial masses. Endometrium Thickness: 7 mm. Right ovary Measurements: No normal ovary visualized. A complex cystic lesion is seen in the right adnexa measuring 6.6 x 4.1 x 4.8 cm. This lesion has multiple internal septations, some of which show mild thickening and internal blood flow on color and duplex Doppler. Differential diagnosis includes cystic ovarian neoplasm, hydro-pyo-salpinx, and endometrioma. Left ovary Measurements: 2.3 x 2.2 x 2.1 cm = volume: 5.5 mL. Normal appearance/no adnexal mass. Pulsed Doppler evaluation of both ovaries demonstrates normal low-resistance arterial and venous waveforms. Other findings No  abnormal free fluid. IMPRESSION: 6.6 cm complex cystic lesion in the right adnexa, with multiple mildly thickened septations but no soft tissue component. Differential diagnosis includes cystic ovarian neoplasm, however, hydrosalpinx, and endometrioma. No sonographic evidence for ovarian torsion. Electronically Signed   By: Marlyce Sine M.D.   On: 03/03/2024 13:38   US  Abdomen Limited RUQ (LIVER/GB) Result Date: 03/03/2024 CLINICAL DATA:  Right upper quadrant pain for a day EXAM: ULTRASOUND ABDOMEN LIMITED RIGHT UPPER QUADRANT COMPARISON:  CT 03/02/2024. FINDINGS: Gallbladder: Distended gallbladder with layering stones. No wall thickening or adjacent fluid. No reported sonographic Murphy's sign. Common bile duct: Diameter: 3 mm Liver: Diffusely  echogenic hepatic parenchyma consistent with fatty liver infiltration. Portal vein is patent on color Doppler imaging with normal direction of blood flow towards the liver. Other: Evaluation limited by overlapping bowel gas and soft tissue. IMPRESSION: Distended gallbladder with stones. No further sonographic evidence of acute cholecystitis. No ductal dilatation. Fatty liver infiltration. Electronically Signed   By: Adrianna Horde M.D.   On: 03/03/2024 13:22   CT ABDOMEN PELVIS W CONTRAST Result Date: 03/03/2024 CLINICAL DATA:  Abdominal pain. EXAM: CT ABDOMEN AND PELVIS WITH CONTRAST TECHNIQUE: Multidetector CT imaging of the abdomen and pelvis was performed using the standard protocol following bolus administration of intravenous contrast. RADIATION DOSE REDUCTION: This exam was performed according to the departmental dose-optimization program which includes automated exposure control, adjustment of the mA and/or kV according to patient size and/or use of iterative reconstruction technique. CONTRAST:  OMNIPAQUE  IOHEXOL  300 MG/ML  SOLN COMPARISON:  08/12/2023 FINDINGS: Lower chest: Dependent atelectasis. Hepatobiliary: No suspicious focal abnormality within the  liver parenchyma. Similar appearance of subcapsular low-density in the medial segment left liver, likely due to focal fatty deposition. Areas of fatty sparing seen along the subcapsular liver adjacent to the gallbladder fossa. Gallbladder wall is ill-defined with subtle pericholecystic edema. No calcified gallstones evident. No intrahepatic or extrahepatic biliary dilation. Pancreas: 2.4 cm well-defined homogeneous cystic lesion arising from the uncinate process of the pancreas is stable. No main pancreatic duct dilatation. Spleen: No splenomegaly. No suspicious focal mass lesion. Adrenals/Urinary Tract: No adrenal nodule or mass. Kidneys unremarkable. No evidence for hydroureter. The urinary bladder appears normal for the degree of distention. Stomach/Bowel: Stomach is unremarkable. No gastric wall thickening. No evidence of outlet obstruction. Duodenum is normally positioned as is the ligament of Treitz. No small bowel wall thickening. No small bowel dilatation. The terminal ileum is normal. The appendix is normal. No gross colonic mass. No colonic wall thickening. Mild left colonic diverticulosis without diverticulitis. Vascular/Lymphatic: There is mild atherosclerotic calcification of the abdominal aorta without aneurysm. Slight progression of gastrohepatic and hepatoduodenal ligament lymph nodes with 12 mm short axis HDL lymph node seen on 26/3 retrocrural low-density nodules again noted. The precaval node measured previously at 12 mm short axis is 14 mm today on 51/3. 15 mm short axis right common iliac node measured previously is 16 mm today on 63/3. Stranding is identified in the retroperitoneal fat around the aorta and IVC extending into the extraperitoneal fat along the common iliac vessels bilaterally. Reproductive: The uterus is unremarkable. No left adnexal mass. Interval development of a 5.2 x 4.6 x 6.3 cm mixed attenuation lesion in the right adnexal space potentially representing poly cystic change  in the ovary versus a dilated tortuous right fallopian tube. Tubal ligation clips are noted bilaterally. Other: No intraperitoneal free fluid. Musculoskeletal: No worrisome lytic or sclerotic osseous abnormality. IMPRESSION: 1. Interval development of a 5.2 x 4.6 x 6.3 cm mixed attenuation lesion in the right adnexal space potentially representing poly cystic change in the ovary versus a dilated tortuous right Fallopian tube. Pelvic ultrasound recommended to further evaluate. 2. Slight progression of retroperitoneal and right common iliac adenopathy. Stranding is identified in the retroperitoneal fat around the aorta and IVC extending into the extraperitoneal fat along the common iliac vessels bilaterally. Imaging features are nonspecific and could be reactive. Lymphoproliferative disorder not excluded. Close follow-up recommended. 3. Gallbladder wall is ill-defined with subtle pericholecystic edema. No calcified gallstones evident. Imaging features raise the question of acute cholecystitis. Right upper quadrant ultrasound recommended to further evaluate.  4. Stable 2.4 cm well-defined homogeneous cystic lesion arising from the uncinate process of the pancreas. Imaging features are nonspecific but could be related to a side branch IPMN. Follow-up outpatient nonemergent MRI abdomen with and without contrast recommended to further evaluate. 5.  Aortic Atherosclerosis (ICD10-I70.0). Electronically Signed   By: Donnal Fusi M.D.   On: 03/03/2024 11:22    EKG: Independently reviewed. Sinus rhythm.   Assessment/Plan   1. Upper abdominal pain; nausea  - Check HIDA scan, continue bowel-rest for now, pain-control, and supportive care  2. Hypertensive urgency  - Improved with analgesia  - Continue pain-control, use labetalol if needed    3. Type II DM  - Check CBGs and use low-intensity SSI for now    4. Complex adnexal cyst - Complex adnexal cyst and slightly increased adenopathy noted on imaging in ED  -  OBGYN (Dr. Racheal Buddle) recommended checking ROMA (ordered) and close outpatient follow-up    5. Pancreatic lesion  - Outpatient follow-up recommended -   6. Asthma  - Not in exacerbation  - Albuterol  as-needed     DVT prophylaxis: SCDs  Code Status: Full  Level of Care: Level of care: Med-Surg Family Communication: Family at bedside  Disposition Plan:  Patient is from: home  Anticipated d/c is to: Home Anticipated d/c date is: Possibly as early as 03/04/24  Patient currently: Pending HIDA scan, pain-control  Consults called: Surgery  Admission status: Observation     Walton Guppy, MD [facility]  03/03/2024, 6:06 PM

## 2024-03-03 NOTE — Consult Note (Signed)
 Reason for Consult: Abdominal pain Referring Physician: Monnie Anthony MD  Lauren Bradford is an 48 y.o. female.  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  She also had signs of periaortic and iliac chain lymphadenopathy.  She also has a 2 cm cyst in the pancreas.  She developed severe epigastric and right upper quadrant pain last night.  This was more intense than the last time she presented with abdominal pain.  Past Medical History:  Diagnosis Date   Advanced stage glaucoma    limited vision   Asthma    Glaucoma    Hypertension    Legal blindness    Ovarian cyst ER   right   Type 2 diabetes mellitus (HCC)     Past Surgical History:  Procedure Laterality Date   CESAREAN SECTION  2002   EYE SURGERY     several   TUBAL LIGATION  2003    Family History  Problem Relation Age of Onset   Diabetes Mother    Hyperlipidemia Mother    Hypertension Mother    Diabetes Father    Hypertension Father    Stroke Father    Cancer Father    Cancer Maternal Grandfather        lung   Colon cancer Neg Hx    Colon polyps Neg Hx    Esophageal cancer Neg Hx    Rectal cancer Neg Hx    Stomach cancer Neg Hx     Social History:  reports that she has quit smoking. She has never used smokeless tobacco. She reports that she does not drink alcohol and does not use drugs.  Allergies:  Allergies  Allergen Reactions   Prednisone Hives, Itching and Swelling   Latex Rash   Yeast-Derived Drug Products Rash    Wheezing     Medications: I have reviewed the patient's current medications.  Results for orders placed or performed during the hospital encounter of 03/03/24 (from the past 48 hours)  Comprehensive metabolic panel     Status: Abnormal   Collection Time: 03/03/24  8:44 AM  Result Value Ref Range   Sodium 132 (L) 135 - 145 mmol/L   Potassium 3.5 3.5 - 5.1 mmol/L   Chloride 96 (L) 98 - 111 mmol/L    CO2 26 22 - 32 mmol/L   Glucose, Bld 242 (H) 70 - 99 mg/dL    Comment: Glucose reference range applies only to samples taken after fasting for at least 8 hours.   BUN 12 6 - 20 mg/dL   Creatinine, Ser 1.61 0.44 - 1.00 mg/dL   Calcium 9.2 8.9 - 09.6 mg/dL   Total Protein 7.7 6.5 - 8.1 g/dL   Albumin 3.8 3.5 - 5.0 g/dL   AST 15 15 - 41 U/L   ALT 18 0 - 44 U/L   Alkaline Phosphatase 121 38 - 126 U/L   Total Bilirubin 0.7 0.0 - 1.2 mg/dL   GFR, Estimated >04 >54 mL/min    Comment: (NOTE) Calculated using the CKD-EPI Creatinine Equation (2021)    Anion gap 10 5 - 15    Comment: Performed at [facility], [address], [place], [place] [address]  Lipase, blood     Status: None   Collection Time: 03/03/24  8:44 AM  Result Value Ref Range   Lipase 34 11 - 51 U/L    Comment: Performed at [facility],  [address], [place], [place] [address]  CBC with Diff     Status: Abnormal   Collection Time: 03/03/24  8:44 AM  Result Value Ref Range   WBC 6.2 4.0 - 10.5 K/uL   RBC 4.12 3.87 - 5.11 MIL/uL   Hemoglobin 11.7 (L) 12.0 - 15.0 g/dL   HCT 65.7 (L) 84.6 - 96.2 %   MCV 87.1 80.0 - 100.0 fL   MCH 28.4 26.0 - 34.0 pg   MCHC 32.6 30.0 - 36.0 g/dL   RDW 95.2 84.1 - 32.4 %   Platelets 312 150 - 400 K/uL   nRBC 0.0 0.0 - 0.2 %   Neutrophils Relative % 69 %   Neutro Abs 4.4 1.7 - 7.7 K/uL   Lymphocytes Relative 21 %   Lymphs Abs 1.3 0.7 - 4.0 K/uL   Monocytes Relative 7 %   Monocytes Absolute 0.4 0.1 - 1.0 K/uL   Eosinophils Relative 1 %   Eosinophils Absolute 0.1 0.0 - 0.5 K/uL   Basophils Relative 1 %   Basophils Absolute 0.0 0.0 - 0.1 K/uL   Immature Granulocytes 1 %   Abs Immature Granulocytes 0.07 0.00 - 0.07 K/uL    Comment: Performed at [facility], [address] [place], [place] [address]  hCG, serum, qualitative     Status: None   Collection Time: 03/03/24  8:44 AM  Result Value Ref Range   Preg, Serum NEGATIVE  NEGATIVE    Comment:        THE SENSITIVITY OF THIS METHODOLOGY IS >10 mIU/mL. Performed at [facility], [address], [place], [place] [address]   Troponin I (High Sensitivity)     Status: None   Collection Time: 03/03/24  8:44 AM  Result Value Ref Range   Troponin I (High Sensitivity) 4 <18 ng/L    Comment: (NOTE) Elevated high sensitivity troponin I (hsTnI) values and significant  changes across serial measurements may suggest ACS but many other  chronic and acute conditions are known to elevate hsTnI results.  Refer to the "Links" section for chest pain algorithms and additional  guidance. Performed at [facility], [address], [place], [place] [address]   Urinalysis, Routine w reflex microscopic -Urine, Clean Catch     Status: Abnormal   Collection Time: 03/03/24 11:20 AM  Result Value Ref Range   Color, Urine YELLOW YELLOW   APPearance HAZY (A) CLEAR   Specific Gravity, Urine 1.028 1.005 - 1.030   pH 7.0 5.0 - 8.0   Glucose, UA >=500 (A) NEGATIVE mg/dL   Hgb urine dipstick NEGATIVE NEGATIVE   Bilirubin Urine NEGATIVE NEGATIVE   Ketones, ur NEGATIVE NEGATIVE mg/dL   Protein, ur NEGATIVE NEGATIVE mg/dL   Nitrite POSITIVE (A) NEGATIVE   Leukocytes,Ua NEGATIVE NEGATIVE   RBC / HPF 0-5 0 - 5 RBC/hpf   WBC, UA 0-5 0 - 5 WBC/hpf   Bacteria, UA RARE (A) NONE SEEN   Squamous Epithelial / HPF 0-5 0 - 5 /HPF   Mucus PRESENT     Comment: Performed at [facility], [address] [place], [place] [address]  Glucose, capillary     Status: Abnormal   Collection Time: 03/03/24  7:22 PM  Result Value Ref Range   Glucose-Capillary 210 (H) 70 - 99 mg/dL    Comment: Glucose reference range applies only to samples taken after fasting for at least 8 hours.    US  Pelvis Complete Result Date:  03/03/2024 CLINICAL DATA:  Right-sided abdominal and pelvic pain. Right adnexal cystic lesion on recent CT. EXAM: TRANSABDOMINAL AND  TRANSVAGINAL ULTRASOUND OF PELVIS DOPPLER ULTRASOUND OF OVARIES TECHNIQUE: Both transabdominal and transvaginal ultrasound examinations of the pelvis were performed. Transabdominal technique was performed for global imaging of the pelvis including uterus, ovaries, adnexal regions, and pelvic cul-de-sac. It was necessary to proceed with endovaginal exam following the transabdominal exam to visualize the right adnexal lesion. Color and duplex Doppler ultrasound was utilized to evaluate blood flow to the ovaries. COMPARISON:  CT on 03/03/2024 FINDINGS: Uterus Measurements: 7.2 x 4.5 x 5.2 cm = volume: 89 mL. Diffusely heterogeneous myometrial echotexture is seen, without discrete myometrial masses. Endometrium Thickness: 7 mm. Right ovary Measurements: No normal ovary visualized. A complex cystic lesion is seen in the right adnexa measuring 6.6 x 4.1 x 4.8 cm. This lesion has multiple internal septations, some of which show mild thickening and internal blood flow on color and duplex Doppler. Differential diagnosis includes cystic ovarian neoplasm, hydro-pyo-salpinx, and endometrioma. Left ovary Measurements: 2.3 x 2.2 x 2.1 cm = volume: 5.5 mL. Normal appearance/no adnexal mass. Pulsed Doppler evaluation of both ovaries demonstrates normal low-resistance arterial and venous waveforms. Other findings No abnormal free fluid. IMPRESSION: 6.6 cm complex cystic lesion in the right adnexa, with multiple mildly thickened septations but no soft tissue component. Differential diagnosis includes cystic ovarian neoplasm, however, hydrosalpinx, and endometrioma. No sonographic evidence for ovarian torsion. Electronically Signed   By: Marlyce Sine M.D.   On: 03/03/2024 13:38   US  Transvaginal Non-OB Result Date: 03/03/2024 CLINICAL DATA:  Right-sided abdominal and pelvic pain. Right adnexal cystic lesion on recent CT. EXAM: TRANSABDOMINAL AND TRANSVAGINAL ULTRASOUND OF PELVIS DOPPLER ULTRASOUND OF OVARIES TECHNIQUE: Both  transabdominal and transvaginal ultrasound examinations of the pelvis were performed. Transabdominal technique was performed for global imaging of the pelvis including uterus, ovaries, adnexal regions, and pelvic cul-de-sac. It was necessary to proceed with endovaginal exam following the transabdominal exam to visualize the right adnexal lesion. Color and duplex Doppler ultrasound was utilized to evaluate blood flow to the ovaries. COMPARISON:  CT on 03/03/2024 FINDINGS: Uterus Measurements: 7.2 x 4.5 x 5.2 cm = volume: 89 mL. Diffusely heterogeneous myometrial echotexture is seen, without discrete myometrial masses. Endometrium Thickness: 7 mm. Right ovary Measurements: No normal ovary visualized. A complex cystic lesion is seen in the right adnexa measuring 6.6 x 4.1 x 4.8 cm. This lesion has multiple internal septations, some of which show mild thickening and internal blood flow on color and duplex Doppler. Differential diagnosis includes cystic ovarian neoplasm, hydro-pyo-salpinx, and endometrioma. Left ovary Measurements: 2.3 x 2.2 x 2.1 cm = volume: 5.5 mL. Normal appearance/no adnexal mass. Pulsed Doppler evaluation of both ovaries demonstrates normal low-resistance arterial and venous waveforms. Other findings No abnormal free fluid. IMPRESSION: 6.6 cm complex cystic lesion in the right adnexa, with multiple mildly thickened septations but no soft tissue component. Differential diagnosis includes cystic ovarian neoplasm, however, hydrosalpinx, and endometrioma. No sonographic evidence for ovarian torsion. Electronically Signed   By: Marlyce Sine M.D.   On: 03/03/2024 13:38   US  Art/Ven Flow Abd Pelv Doppler Result Date: 03/03/2024 CLINICAL DATA:  Right-sided abdominal and pelvic pain. Right adnexal cystic lesion on recent CT. EXAM: TRANSABDOMINAL AND TRANSVAGINAL ULTRASOUND OF PELVIS DOPPLER ULTRASOUND OF OVARIES TECHNIQUE: Both transabdominal and transvaginal ultrasound examinations of the pelvis were  performed. Transabdominal technique was performed for global imaging of the pelvis including uterus, ovaries, adnexal regions, and pelvic cul-de-sac. It  was necessary to proceed with endovaginal exam following the transabdominal exam to visualize the right adnexal lesion. Color and duplex Doppler ultrasound was utilized to evaluate blood flow to the ovaries. COMPARISON:  CT on 03/03/2024 FINDINGS: Uterus Measurements: 7.2 x 4.5 x 5.2 cm = volume: 89 mL. Diffusely heterogeneous myometrial echotexture is seen, without discrete myometrial masses. Endometrium Thickness: 7 mm. Right ovary Measurements: No normal ovary visualized. A complex cystic lesion is seen in the right adnexa measuring 6.6 x 4.1 x 4.8 cm. This lesion has multiple internal septations, some of which show mild thickening and internal blood flow on color and duplex Doppler. Differential diagnosis includes cystic ovarian neoplasm, hydro-pyo-salpinx, and endometrioma. Left ovary Measurements: 2.3 x 2.2 x 2.1 cm = volume: 5.5 mL. Normal appearance/no adnexal mass. Pulsed Doppler evaluation of both ovaries demonstrates normal low-resistance arterial and venous waveforms. Other findings No abnormal free fluid. IMPRESSION: 6.6 cm complex cystic lesion in the right adnexa, with multiple mildly thickened septations but no soft tissue component. Differential diagnosis includes cystic ovarian neoplasm, however, hydrosalpinx, and endometrioma. No sonographic evidence for ovarian torsion. Electronically Signed   By: Marlyce Sine M.D.   On: 03/03/2024 13:38   US  Abdomen Limited RUQ (LIVER/GB) Result Date: 03/03/2024 CLINICAL DATA:  Right upper quadrant pain for a day EXAM: ULTRASOUND ABDOMEN LIMITED RIGHT UPPER QUADRANT COMPARISON:  CT 03/02/2024. FINDINGS: Gallbladder: Distended gallbladder with layering stones. No wall thickening or adjacent fluid. No reported sonographic Murphy's sign. Common bile duct: Diameter: 3 mm Liver: Diffusely echogenic hepatic  parenchyma consistent with fatty liver infiltration. Portal vein is patent on color Doppler imaging with normal direction of blood flow towards the liver. Other: Evaluation limited by overlapping bowel gas and soft tissue. IMPRESSION: Distended gallbladder with stones. No further sonographic evidence of acute cholecystitis. No ductal dilatation. Fatty liver infiltration. Electronically Signed   By: Adrianna Horde M.D.   On: 03/03/2024 13:22   CT ABDOMEN PELVIS W CONTRAST Result Date: 03/03/2024 CLINICAL DATA:  Abdominal pain. EXAM: CT ABDOMEN AND PELVIS WITH CONTRAST TECHNIQUE: Multidetector CT imaging of the abdomen and pelvis was performed using the standard protocol following bolus administration of intravenous contrast. RADIATION DOSE REDUCTION: This exam was performed according to the departmental dose-optimization program which includes automated exposure control, adjustment of the mA and/or kV according to patient size and/or use of iterative reconstruction technique. CONTRAST:  OMNIPAQUE  IOHEXOL  300 MG/ML  SOLN COMPARISON:  08/12/2023 FINDINGS: Lower chest: Dependent atelectasis. Hepatobiliary: No suspicious focal abnormality within the liver parenchyma. Similar appearance of subcapsular low-density in the medial segment left liver, likely due to focal fatty deposition. Areas of fatty sparing seen along the subcapsular liver adjacent to the gallbladder fossa. Gallbladder wall is ill-defined with subtle pericholecystic edema. No calcified gallstones evident. No intrahepatic or extrahepatic biliary dilation. Pancreas: 2.4 cm well-defined homogeneous cystic lesion arising from the uncinate process of the pancreas is stable. No main pancreatic duct dilatation. Spleen: No splenomegaly. No suspicious focal mass lesion. Adrenals/Urinary Tract: No adrenal nodule or mass. Kidneys unremarkable. No evidence for hydroureter. The urinary bladder appears normal for the degree of distention. Stomach/Bowel: Stomach  is unremarkable. No gastric wall thickening. No evidence of outlet obstruction. Duodenum is normally positioned as is the ligament of Treitz. No small bowel wall thickening. No small bowel dilatation. The terminal ileum is normal. The appendix is normal. No gross colonic mass. No colonic wall thickening. Mild left colonic diverticulosis without diverticulitis. Vascular/Lymphatic: There is mild atherosclerotic calcification of the abdominal aorta without  aneurysm. Slight progression of gastrohepatic and hepatoduodenal ligament lymph nodes with 12 mm short axis HDL lymph node seen on 26/3 retrocrural low-density nodules again noted. The precaval node measured previously at 12 mm short axis is 14 mm today on 51/3. 15 mm short axis right common iliac node measured previously is 16 mm today on 63/3. Stranding is identified in the retroperitoneal fat around the aorta and IVC extending into the extraperitoneal fat along the common iliac vessels bilaterally. Reproductive: The uterus is unremarkable. No left adnexal mass. Interval development of a 5.2 x 4.6 x 6.3 cm mixed attenuation lesion in the right adnexal space potentially representing poly cystic change in the ovary versus a dilated tortuous right fallopian tube. Tubal ligation clips are noted bilaterally. Other: No intraperitoneal free fluid. Musculoskeletal: No worrisome lytic or sclerotic osseous abnormality. IMPRESSION: 1. Interval development of a 5.2 x 4.6 x 6.3 cm mixed attenuation lesion in the right adnexal space potentially representing poly cystic change in the ovary versus a dilated tortuous right Fallopian tube. Pelvic ultrasound recommended to further evaluate. 2. Slight progression of retroperitoneal and right common iliac adenopathy. Stranding is identified in the retroperitoneal fat around the aorta and IVC extending into the extraperitoneal fat along the common iliac vessels bilaterally. Imaging features are nonspecific and could be reactive.  Lymphoproliferative disorder not excluded. Close follow-up recommended. 3. Gallbladder wall is ill-defined with subtle pericholecystic edema. No calcified gallstones evident. Imaging features raise the question of acute cholecystitis. Right upper quadrant ultrasound recommended to further evaluate. 4. Stable 2.4 cm well-defined homogeneous cystic lesion arising from the uncinate process of the pancreas. Imaging features are nonspecific but could be related to a side branch IPMN. Follow-up outpatient nonemergent MRI abdomen with and without contrast recommended to further evaluate. 5.  Aortic Atherosclerosis (ICD10-I70.0). Electronically Signed   By: Donnal Fusi M.D.   On: 03/03/2024 11:22    Review of Systems  Gastrointestinal:  Positive for abdominal pain and nausea.  All other systems reviewed and are negative.  Blood pressure (!) 175/96, pulse 79, temperature 98.4 F (36.9 C), temperature source Oral, resp. rate (!) 21, height 5\' 5"  (1.651 m), weight 104.8 kg, SpO2 98%. Physical Exam Constitutional:      Appearance: She is obese.  HENT:     Head: Normocephalic.  Eyes:     General: No scleral icterus.    Comments: Patient is blind  Cardiovascular:     Rate and Rhythm: Normal rate.  Pulmonary:     Effort: Pulmonary effort is normal.  Abdominal:     Tenderness: There is abdominal tenderness in the right upper quadrant.     Hernia: No hernia is present.  Skin:    General: Skin is warm.  Neurological:     General: No focal deficit present.     Mental Status: She is alert.  Psychiatric:        Mood and Affect: Mood normal.     Assessment/Plan: Abdominal pain right upper quadrant with concern for cholecystitis secondary to gallstones-ultrasound reviewed and there is no gallbladder wall thickening but she does have stones.  She is tender so I suspect she may have cholecystitis.  Recommend HIDA study in a.m. to evaluate.  Pelvic mass with chronic periaortic, retroperitoneal and iliac  chain lymphadenopathy of unknown etiology-6 cm cystic mass in the adnexa noted.  Recommend gynecological oncology consultation for further workup.  The mass was not present on previous CT scan dating 11/24.  Pelvic ultrasound reviewed.  May need to  reassess etiology of lymphadenopathy given pelvic mass.    Type 2 diabetes-Per medical  Hypertension-per medical service Rodrigo Clara  MD  03/03/2024, 9:16 PM    High complexity

## 2024-03-03 NOTE — ED Provider Notes (Signed)
 [place] EMERGENCY DEPARTMENT AT [facility] Provider Note   CSN: [id number] Arrival date & time: 03/03/24  4098     History  Chief Complaint  Patient presents with  . Abdominal Pain    Lauren Bradford is a 48 y.o. female.   Abdominal Pain    Patient has history of asthma ovarian cyst glaucoma hypertension diabetes.  Patient presents to the ED with complaints of abdominal pain that started last evening.  Patient states the pain has been in her upper abdomen.  Does go to her back somewhat.  Pain is primarily in the midline.  It is severe in nature.  She felt like she had to have a bowel movement but was not able to last night.  She has had nausea but no vomiting.  No dysuria.  No prior surgeries.  No chest pain or shortness of breath.  Home Medications Prior to Admission medications   Medication Sig Start Date End Date Taking? Authorizing Provider  acetaZOLAMIDE  (DIAMOX ) 250 MG tablet Take 500 mg by mouth daily.  07/10/15   [provider]  albuterol  (PROVENTIL  HFA;VENTOLIN  HFA) 108 (90 BASE) MCG/ACT inhaler Inhale 2 puffs into the lungs every 6 (six) hours as needed for wheezing or shortness of breath.     [provider]  brimonidine  (ALPHAGAN  P) 0.1 % SOLN Apply 1 drop to eye 3 (three) times daily.  09/12/14   [provider]  cyclobenzaprine  (FLEXERIL ) 10 MG tablet Take 1 tablet (10 mg total) by mouth 2 (two) times daily as needed for muscle spasms. 03/13/20   Landsee, Adam, MD  diclofenac  Sodium (VOLTAREN ) 1 % GEL Apply 4 g topically 4 (four) times daily. 05/04/21   Floyd, Dan, DO  Difluprednate  0.05 % EMUL Place 1 drop into the right eye 2 (two) times daily.  09/21/14   [provider]  famotidine  (PEPCID ) 20 MG tablet Take 1 tablet (20 mg total) by mouth 2 (two) times daily. 03/22/21   Mortenson, Ashley, MD  hydrochlorothiazide  (HYDRODIURIL ) 25 MG tablet Take 25 mg by mouth daily.  08/13/09   [provider]  hyoscyamine   (LEVSIN/SL) 0.125 MG SL tablet Place 1 tablet (0.125 mg total) under the tongue every 4 (four) hours as needed. 08/12/23   Smoot, Genevive Ket, PA-C  ibuprofen  (ADVIL ) 600 MG tablet Take 1 tablet (600 mg total) by mouth every 6 (six) hours as needed. 03/22/21   Mortenson, Ashley, MD  loratadine  (CLARITIN ) 10 MG tablet Take 1 tablet (10 mg total) by mouth daily for 5 days. 03/22/21 03/27/21  Ethlyn Herd, MD  omeprazole  (PRILOSEC) 20 MG capsule Take 1 capsule (20 mg total) by mouth daily. 08/12/23   Smoot, Genevive Ket, PA-C  ondansetron  (ZOFRAN -ODT) 4 MG disintegrating tablet Take 1 tablet (4 mg total) by mouth every 8 (eight) hours as needed for nausea or vomiting. 08/12/23   Smoot, Genevive Ket, PA-C  oxyCODONE -acetaminophen  (PERCOCET/ROXICET) 5-325 MG tablet Take 1 tablet by mouth every 6 (six) hours as needed for severe pain (pain score 7-10). 08/12/23   Smoot, Genevive Ket, PA-C  potassium chloride  SA (KLOR-CON  M20) 20 MEQ tablet Take 1 tablet (20 mEq total) by mouth daily. 09/22/16   Lynnda Sas, MD  ROCKLATAN 0.02-0.005 % SOLN Apply to eye. 12/30/20   [provider]  traMADol  (ULTRAM ) 50 MG tablet Take 50 mg by mouth 4 (four) times daily as needed for moderate pain.  07/07/19   [provider]  TRESIBA FLEXTOUCH 100 UNIT/ML FlexTouch Pen Inject  60 Units into the skin at bedtime. 11/22/20   [provider]      Allergies    Prednisone, Latex, and Yeast-derived drug products    Review of Systems   Review of Systems  Gastrointestinal:  Positive for abdominal pain.    Physical Exam Updated Vital Signs BP (!) 145/71   Pulse 83   Temp 99.5 F (37.5 C)   Resp (!) 24   Ht 1.651 m (5\' 5" )   Wt 104.8 kg   SpO2 97%   BMI 38.44 kg/m  Physical Exam Vitals and nursing note reviewed.  Constitutional:      General: She is not in acute distress.    Appearance: She is well-developed.  HENT:     Head: Normocephalic and atraumatic.     Right Ear: External ear normal.     Left Ear:  External ear normal.  Eyes:     General: No scleral icterus.       Right eye: No discharge.        Left eye: No discharge.     Conjunctiva/sclera: Conjunctivae normal.  Neck:     Trachea: No tracheal deviation.  Cardiovascular:     Rate and Rhythm: Normal rate and regular rhythm.  Pulmonary:     Effort: Pulmonary effort is normal. No respiratory distress.     Breath sounds: Normal breath sounds. No stridor. No wheezing or rales.  Abdominal:     General: Bowel sounds are normal. There is no distension.     Palpations: Abdomen is soft.     Tenderness: There is abdominal tenderness in the epigastric area. There is guarding. There is no rebound.  Musculoskeletal:        General: No tenderness or deformity.     Cervical back: Neck supple.  Skin:    General: Skin is warm and dry.     Findings: No rash.  Neurological:     General: No focal deficit present.     Mental Status: She is alert.     Cranial Nerves: No cranial nerve deficit, dysarthria or facial asymmetry.     Sensory: No sensory deficit.     Motor: No abnormal muscle tone or seizure activity.     Coordination: Coordination normal.  Psychiatric:        Mood and Affect: Mood normal.     ED Results / Procedures / Treatments   Labs (all labs ordered are listed, but only abnormal results are displayed) Labs Reviewed  COMPREHENSIVE METABOLIC PANEL WITH GFR - Abnormal; Notable for the following components:      Result Value   Sodium 132 (*)    Chloride 96 (*)    Glucose, Bld 242 (*)    All other components within normal limits  CBC WITH DIFFERENTIAL/PLATELET - Abnormal; Notable for the following components:   Hemoglobin 11.7 (*)    HCT 35.9 (*)    All other components within normal limits  URINALYSIS, ROUTINE W REFLEX MICROSCOPIC - Abnormal; Notable for the following components:   APPearance HAZY (*)    Glucose, UA >=500 (*)    Nitrite POSITIVE (*)    Bacteria, UA RARE (*)    All other components within normal limits   LIPASE, BLOOD  HCG, SERUM, QUALITATIVE  TROPONIN I (HIGH SENSITIVITY)    EKG EKG Interpretation Date/Time:  Monday Mar 03 2024 08:39:03 EDT Ventricular Rate:  85 PR Interval:  163 QRS Duration:  91 QT Interval:  395 QTC Calculation: 470 R Axis:   -  6  Text Interpretation: Sinus rhythm Low voltage, precordial leads No significant change since last tracing Confirmed by Trish Furl [phone]) on 03/03/2024 9:45:36 AM  Radiology US  Pelvis Complete Result Date: 03/03/2024 CLINICAL DATA:  Right-sided abdominal and pelvic pain. Right adnexal cystic lesion on recent CT. EXAM: TRANSABDOMINAL AND TRANSVAGINAL ULTRASOUND OF PELVIS DOPPLER ULTRASOUND OF OVARIES TECHNIQUE: Both transabdominal and transvaginal ultrasound examinations of the pelvis were performed. Transabdominal technique was performed for global imaging of the pelvis including uterus, ovaries, adnexal regions, and pelvic cul-de-sac. It was necessary to proceed with endovaginal exam following the transabdominal exam to visualize the right adnexal lesion. Color and duplex Doppler ultrasound was utilized to evaluate blood flow to the ovaries. COMPARISON:  CT on 03/03/2024 FINDINGS: Uterus Measurements: 7.2 x 4.5 x 5.2 cm = volume: 89 mL. Diffusely heterogeneous myometrial echotexture is seen, without discrete myometrial masses. Endometrium Thickness: 7 mm. Right ovary Measurements: No normal ovary visualized. A complex cystic lesion is seen in the right adnexa measuring 6.6 x 4.1 x 4.8 cm. This lesion has multiple internal septations, some of which show mild thickening and internal blood flow on color and duplex Doppler. Differential diagnosis includes cystic ovarian neoplasm, hydro-pyo-salpinx, and endometrioma. Left ovary Measurements: 2.3 x 2.2 x 2.1 cm = volume: 5.5 mL. Normal appearance/no adnexal mass. Pulsed Doppler evaluation of both ovaries demonstrates normal low-resistance arterial and venous waveforms. Other findings No abnormal free  fluid. IMPRESSION: 6.6 cm complex cystic lesion in the right adnexa, with multiple mildly thickened septations but no soft tissue component. Differential diagnosis includes cystic ovarian neoplasm, however, hydrosalpinx, and endometrioma. No sonographic evidence for ovarian torsion. Electronically Signed   By: Marlyce Sine M.D.   On: 03/03/2024 13:38   US  Transvaginal Non-OB Result Date: 03/03/2024 CLINICAL DATA:  Right-sided abdominal and pelvic pain. Right adnexal cystic lesion on recent CT. EXAM: TRANSABDOMINAL AND TRANSVAGINAL ULTRASOUND OF PELVIS DOPPLER ULTRASOUND OF OVARIES TECHNIQUE: Both transabdominal and transvaginal ultrasound examinations of the pelvis were performed. Transabdominal technique was performed for global imaging of the pelvis including uterus, ovaries, adnexal regions, and pelvic cul-de-sac. It was necessary to proceed with endovaginal exam following the transabdominal exam to visualize the right adnexal lesion. Color and duplex Doppler ultrasound was utilized to evaluate blood flow to the ovaries. COMPARISON:  CT on 03/03/2024 FINDINGS: Uterus Measurements: 7.2 x 4.5 x 5.2 cm = volume: 89 mL. Diffusely heterogeneous myometrial echotexture is seen, without discrete myometrial masses. Endometrium Thickness: 7 mm. Right ovary Measurements: No normal ovary visualized. A complex cystic lesion is seen in the right adnexa measuring 6.6 x 4.1 x 4.8 cm. This lesion has multiple internal septations, some of which show mild thickening and internal blood flow on color and duplex Doppler. Differential diagnosis includes cystic ovarian neoplasm, hydro-pyo-salpinx, and endometrioma. Left ovary Measurements: 2.3 x 2.2 x 2.1 cm = volume: 5.5 mL. Normal appearance/no adnexal mass. Pulsed Doppler evaluation of both ovaries demonstrates normal low-resistance arterial and venous waveforms. Other findings No abnormal free fluid. IMPRESSION: 6.6 cm complex cystic lesion in the right adnexa, with multiple  mildly thickened septations but no soft tissue component. Differential diagnosis includes cystic ovarian neoplasm, however, hydrosalpinx, and endometrioma. No sonographic evidence for ovarian torsion. Electronically Signed   By: Marlyce Sine M.D.   On: 03/03/2024 13:38   US  Art/Ven Flow Abd Pelv Doppler Result Date: 03/03/2024 CLINICAL DATA:  Right-sided abdominal and pelvic pain. Right adnexal cystic lesion on recent CT. EXAM: TRANSABDOMINAL AND TRANSVAGINAL ULTRASOUND OF PELVIS DOPPLER ULTRASOUND OF OVARIES  TECHNIQUE: Both transabdominal and transvaginal ultrasound examinations of the pelvis were performed. Transabdominal technique was performed for global imaging of the pelvis including uterus, ovaries, adnexal regions, and pelvic cul-de-sac. It was necessary to proceed with endovaginal exam following the transabdominal exam to visualize the right adnexal lesion. Color and duplex Doppler ultrasound was utilized to evaluate blood flow to the ovaries. COMPARISON:  CT on 03/03/2024 FINDINGS: Uterus Measurements: 7.2 x 4.5 x 5.2 cm = volume: 89 mL. Diffusely heterogeneous myometrial echotexture is seen, without discrete myometrial masses. Endometrium Thickness: 7 mm. Right ovary Measurements: No normal ovary visualized. A complex cystic lesion is seen in the right adnexa measuring 6.6 x 4.1 x 4.8 cm. This lesion has multiple internal septations, some of which show mild thickening and internal blood flow on color and duplex Doppler. Differential diagnosis includes cystic ovarian neoplasm, hydro-pyo-salpinx, and endometrioma. Left ovary Measurements: 2.3 x 2.2 x 2.1 cm = volume: 5.5 mL. Normal appearance/no adnexal mass. Pulsed Doppler evaluation of both ovaries demonstrates normal low-resistance arterial and venous waveforms. Other findings No abnormal free fluid. IMPRESSION: 6.6 cm complex cystic lesion in the right adnexa, with multiple mildly thickened septations but no soft tissue component. Differential  diagnosis includes cystic ovarian neoplasm, however, hydrosalpinx, and endometrioma. No sonographic evidence for ovarian torsion. Electronically Signed   By: Marlyce Sine M.D.   On: 03/03/2024 13:38   US  Abdomen Limited RUQ (LIVER/GB) Result Date: 03/03/2024 CLINICAL DATA:  Right upper quadrant pain for a day EXAM: ULTRASOUND ABDOMEN LIMITED RIGHT UPPER QUADRANT COMPARISON:  CT 03/02/2024. FINDINGS: Gallbladder: Distended gallbladder with layering stones. No wall thickening or adjacent fluid. No reported sonographic Murphy's sign. Common bile duct: Diameter: 3 mm Liver: Diffusely echogenic hepatic parenchyma consistent with fatty liver infiltration. Portal vein is patent on color Doppler imaging with normal direction of blood flow towards the liver. Other: Evaluation limited by overlapping bowel gas and soft tissue. IMPRESSION: Distended gallbladder with stones. No further sonographic evidence of acute cholecystitis. No ductal dilatation. Fatty liver infiltration. Electronically Signed   By: Adrianna Horde M.D.   On: 03/03/2024 13:22   CT ABDOMEN PELVIS W CONTRAST Result Date: 03/03/2024 CLINICAL DATA:  Abdominal pain. EXAM: CT ABDOMEN AND PELVIS WITH CONTRAST TECHNIQUE: Multidetector CT imaging of the abdomen and pelvis was performed using the standard protocol following bolus administration of intravenous contrast. RADIATION DOSE REDUCTION: This exam was performed according to the departmental dose-optimization program which includes automated exposure control, adjustment of the mA and/or kV according to patient size and/or use of iterative reconstruction technique. CONTRAST:  OMNIPAQUE  IOHEXOL  300 MG/ML  SOLN COMPARISON:  08/12/2023 FINDINGS: Lower chest: Dependent atelectasis. Hepatobiliary: No suspicious focal abnormality within the liver parenchyma. Similar appearance of subcapsular low-density in the medial segment left liver, likely due to focal fatty deposition. Areas of fatty sparing seen along  the subcapsular liver adjacent to the gallbladder fossa. Gallbladder wall is ill-defined with subtle pericholecystic edema. No calcified gallstones evident. No intrahepatic or extrahepatic biliary dilation. Pancreas: 2.4 cm well-defined homogeneous cystic lesion arising from the uncinate process of the pancreas is stable. No main pancreatic duct dilatation. Spleen: No splenomegaly. No suspicious focal mass lesion. Adrenals/Urinary Tract: No adrenal nodule or mass. Kidneys unremarkable. No evidence for hydroureter. The urinary bladder appears normal for the degree of distention. Stomach/Bowel: Stomach is unremarkable. No gastric wall thickening. No evidence of outlet obstruction. Duodenum is normally positioned as is the ligament of Treitz. No small bowel wall thickening. No small bowel dilatation. The terminal ileum  is normal. The appendix is normal. No gross colonic mass. No colonic wall thickening. Mild left colonic diverticulosis without diverticulitis. Vascular/Lymphatic: There is mild atherosclerotic calcification of the abdominal aorta without aneurysm. Slight progression of gastrohepatic and hepatoduodenal ligament lymph nodes with 12 mm short axis HDL lymph node seen on 26/3 retrocrural low-density nodules again noted. The precaval node measured previously at 12 mm short axis is 14 mm today on 51/3. 15 mm short axis right common iliac node measured previously is 16 mm today on 63/3. Stranding is identified in the retroperitoneal fat around the aorta and IVC extending into the extraperitoneal fat along the common iliac vessels bilaterally. Reproductive: The uterus is unremarkable. No left adnexal mass. Interval development of a 5.2 x 4.6 x 6.3 cm mixed attenuation lesion in the right adnexal space potentially representing poly cystic change in the ovary versus a dilated tortuous right fallopian tube. Tubal ligation clips are noted bilaterally. Other: No intraperitoneal free fluid. Musculoskeletal: No  worrisome lytic or sclerotic osseous abnormality. IMPRESSION: 1. Interval development of a 5.2 x 4.6 x 6.3 cm mixed attenuation lesion in the right adnexal space potentially representing poly cystic change in the ovary versus a dilated tortuous right Fallopian tube. Pelvic ultrasound recommended to further evaluate. 2. Slight progression of retroperitoneal and right common iliac adenopathy. Stranding is identified in the retroperitoneal fat around the aorta and IVC extending into the extraperitoneal fat along the common iliac vessels bilaterally. Imaging features are nonspecific and could be reactive. Lymphoproliferative disorder not excluded. Close follow-up recommended. 3. Gallbladder wall is ill-defined with subtle pericholecystic edema. No calcified gallstones evident. Imaging features raise the question of acute cholecystitis. Right upper quadrant ultrasound recommended to further evaluate. 4. Stable 2.4 cm well-defined homogeneous cystic lesion arising from the uncinate process of the pancreas. Imaging features are nonspecific but could be related to a side branch IPMN. Follow-up outpatient nonemergent MRI abdomen with and without contrast recommended to further evaluate. 5.  Aortic Atherosclerosis (ICD10-I70.0). Electronically Signed   By: Donnal Fusi M.D.   On: 03/03/2024 11:22    Procedures Procedures    Medications Ordered in ED Medications  HYDROmorphone  (DILAUDID ) injection 0.5 mg (has no administration in time range)  HYDROmorphone  (DILAUDID ) injection 0.5 mg (0.5 mg Intravenous Given 03/03/24 1121)  ondansetron  (ZOFRAN ) injection 4 mg (4 mg Intravenous Given 03/03/24 0848)  iohexol  (OMNIPAQUE ) 300 MG/ML solution 100 mL (100 mLs Intravenous Contrast Given 03/03/24 1042)    ED Course/ Medical Decision Making/ A&P Clinical Course as of 03/03/24 1740  Mon Mar 03, 2024  0944 hCG, serum, qualitative Pregnancy test negative.  Lipase normal.  Metabolic panel shows increased glucose, no anion  gap no signs of acidosis [JK]  0945 CBC with Diff(!) Normal [JK]  1016 Troponin I (High Sensitivity) Troponin normal [JK]  1110 hCG, serum, qualitative Pregnancy test negative.  Lipase normal [JK]  1142 Urinalysis does show positive nitrite but no definitive signs for infection [JK]  1142 CT scan shows a 5 x 4 x 6 lesion in the right adnexal space.  Pelvic ultrasound recommended.  Patient also noted to have ill-defined gallbladder wall and possible edema.  Patient also has a cystic lesion in the pancreas.  Recommendation for nonemergent MRI also recommended right upper quadrant ultrasound recommended [JK]  1202 Endings discussed with patient.  Will proceed with ultrasound imaging [JK]  1353 Ultrasound shows a 6.6 cm complex cystic lesion in the right adnexa.  Possible hydrosalpinx endometrioma or cystic ovarian neoplasm [JK]  1353 Ultrasound  shows distended gallbladder with stones.  No ductal dilatation. [JK]  1406 On repeat exam patient is not having any lower abdominal tenderness.  She still has tenderness palpation of the right upper quadrant [JK]  1522 Case discussed with Dr Afton Horse.  Will see pt in consultation.  Requests medical admission with her adenopathy and other issues [JK]  1545 Consulted with OB/GYN Dr. Cresenzo, currently in a delivery but will contact me once he is available [JK]  1607 Discussed with Dr Giles Labrum.  Would like to see what GYN recommends before admitting [JK]  1717 Discussed with Dr Racheal Buddle.  Does not feel this would likely be cause of her pain will need outpt follow up.  Add on ROMA test to help with outpt follow up [JK]  1739 Case discussed with Dr Brice Campi regarding admission [JK]    Clinical Course User Index [JK] Trish Furl, MD                                 Medical Decision Making Problems Addressed: Adnexal cyst: undiagnosed new problem with uncertain prognosis Biliary colic: acute illness or injury that poses a threat to life or bodily functions Pancreatic  cyst: undiagnosed new problem with uncertain prognosis  Amount and/or Complexity of Data Reviewed Labs: ordered. Decision-making details documented in ED Course. Radiology: ordered and independent interpretation performed.  Risk Prescription drug management. Decision regarding hospitalization.   Patient presented to the ED for evaluation of abdominal pain.  Patient's pain was primarily in the right upper abdomen.  Patient's laboratory test not suggestive of urinary tract infection.  No pancreatitis or hepatitis.  Consider the possibility of cardiac etiology but EKG and troponins are unremarkable.  CT scan abdomen pelvis was performed.  It did show findings of possible pancreatic cysts as well as cholecystitis and adnexal cyst.  Patient had an ultrasound performed of both his right upper quadrant and pelvis.  Ultrasound of the pelvis shows a 6.8 complex cyst in the right adnexal region.  Patient states she has had that in the past.  Mild patient did have a previous ultrasound in 2016 that showed a 6 mm cyst but I do not see any follow-up imaging since then.  Patient does not have any tenderness in the lower abdomen.  I think this is incidental and discussed the fact that she has enlarged outpatient follow-up.  Patient however continues to have pain in her right upper quadrant.  No definite findings of cholecystitis on ultrasound but she does have cholelithiasis.  I am concerned that she is having persistent biliary colic and possible early cholecystitis.  I will consult with general surgery.         Final Clinical Impression(s) / ED Diagnoses Final diagnoses:  Pancreatic cyst  Biliary colic  Adnexal cyst    Rx / DC Orders ED Discharge Orders     None         Trish Furl, MD 03/03/24 1740

## 2024-03-04 ENCOUNTER — Observation Stay (HOSPITAL_COMMUNITY)

## 2024-03-04 DIAGNOSIS — I700 Atherosclerosis of aorta: Secondary | ICD-10-CM | POA: Insufficient documentation

## 2024-03-04 DIAGNOSIS — K869 Disease of pancreas, unspecified: Secondary | ICD-10-CM | POA: Diagnosis not present

## 2024-03-04 DIAGNOSIS — K810 Acute cholecystitis: Secondary | ICD-10-CM

## 2024-03-04 DIAGNOSIS — H409 Unspecified glaucoma: Secondary | ICD-10-CM | POA: Insufficient documentation

## 2024-03-04 DIAGNOSIS — K8001 Calculus of gallbladder with acute cholecystitis with obstruction: Secondary | ICD-10-CM | POA: Diagnosis not present

## 2024-03-04 DIAGNOSIS — N83201 Unspecified ovarian cyst, right side: Secondary | ICD-10-CM | POA: Diagnosis not present

## 2024-03-04 DIAGNOSIS — E119 Type 2 diabetes mellitus without complications: Secondary | ICD-10-CM | POA: Diagnosis not present

## 2024-03-04 LAB — COMPREHENSIVE METABOLIC PANEL WITH GFR
ALT: 16 U/L (ref 0–44)
AST: 15 U/L (ref 15–41)
Albumin: 3.4 g/dL — ABNORMAL LOW (ref 3.5–5.0)
Alkaline Phosphatase: 113 U/L (ref 38–126)
Anion gap: 12 (ref 5–15)
BUN: 6 mg/dL (ref 6–20)
CO2: 25 mmol/L (ref 22–32)
Calcium: 9.1 mg/dL (ref 8.9–10.3)
Chloride: 97 mmol/L — ABNORMAL LOW (ref 98–111)
Creatinine, Ser: 0.57 mg/dL (ref 0.44–1.00)
GFR, Estimated: >60 mL/min (ref >60)
Glucose, Bld: 205 mg/dL — ABNORMAL HIGH (ref 70–99)
Potassium: 3.4 mmol/L — ABNORMAL LOW (ref 3.5–5.1)
Sodium: 134 mmol/L — ABNORMAL LOW (ref 135–145)
Total Bilirubin: 0.7 mg/dL (ref 0.0–1.2)
Total Protein: 7 g/dL (ref 6.5–8.1)

## 2024-03-04 LAB — GLUCOSE, CAPILLARY
Glucose-Capillary: 213 mg/dL — ABNORMAL HIGH (ref 70–99)
Glucose-Capillary: 151 mg/dL — ABNORMAL HIGH (ref 70–99)
Glucose-Capillary: 198 mg/dL — ABNORMAL HIGH (ref 70–99)
Glucose-Capillary: 217 mg/dL — ABNORMAL HIGH (ref 70–99)
Glucose-Capillary: 161 mg/dL — ABNORMAL HIGH (ref 70–99)

## 2024-03-04 LAB — HEMOGLOBIN A1C
Hgb A1c MFr Bld: 8 % — ABNORMAL HIGH (ref 4.8–5.6)
Mean Plasma Glucose: 182.9 mg/dL

## 2024-03-04 LAB — CBC
HCT: 36.3 % (ref 36.0–46.0)
Hemoglobin: 12.2 g/dL (ref 12.0–15.0)
MCH: 29.2 pg (ref 26.0–34.0)
MCHC: 33.6 g/dL (ref 30.0–36.0)
MCV: 86.8 fL (ref 80.0–100.0)
Platelets: 301 10*3/uL (ref 150–400)
RBC: 4.18 MIL/uL (ref 3.87–5.11)
RDW: 13 % (ref 11.5–15.5)
WBC: 7.5 10*3/uL (ref 4.0–10.5)
nRBC: 0 % (ref 0.0–0.2)

## 2024-03-04 LAB — HIV ANTIBODY (ROUTINE TESTING W REFLEX): HIV Screen 4th Generation wRfx: NONREACTIVE

## 2024-03-04 MED ORDER — TECHNETIUM TC 99M MEBROFENIN IV KIT
5.3000 | PACK | Freq: Once | INTRAVENOUS | Status: AC
  Administered 2024-03-04: 5.3 via INTRAVENOUS

## 2024-03-04 MED ORDER — SODIUM CHLORIDE 0.9% FLUSH: 10.0000 mL | INTRAVENOUS | Status: DC | PRN

## 2024-03-04 MED ORDER — MORPHINE SULFATE (PF) 4 MG/ML IV SOLN
3.0000 mg | Freq: Once | INTRAVENOUS | Status: AC
  Administered 2024-03-04: 3 mg via INTRAVENOUS

## 2024-03-04 MED ORDER — MORPHINE SULFATE (PF) 2 MG/ML IV SOLN
INTRAVENOUS | Status: AC
  Filled 2024-03-04: qty 2

## 2024-03-04 MED ORDER — BRIMONIDINE TARTRATE 0.15 % OP SOLN
1.0000 [drp] | Freq: Three times a day (TID) | OPHTHALMIC | Status: DC
  Administered 2024-03-04 – 2024-03-07 (×8): 1 [drp] via OPHTHALMIC
  Filled 2024-03-04: qty 5

## 2024-03-04 MED ORDER — LACTATED RINGERS IV SOLN: INTRAVENOUS | Status: AC

## 2024-03-04 MED ORDER — KETOROLAC TROMETHAMINE 15 MG/ML IJ SOLN
15.0000 mg | Freq: Once | INTRAMUSCULAR | Status: AC
  Administered 2024-03-04: 15 mg via INTRAVENOUS
  Filled 2024-03-04: qty 1

## 2024-03-04 MED ORDER — DIFLUPREDNATE 0.05 % OP EMUL
1.0000 [drp] | Freq: Two times a day (BID) | OPHTHALMIC | Status: DC
  Administered 2024-03-07: 1 [drp] via OPHTHALMIC

## 2024-03-04 MED ORDER — LATANOPROST 0.005 % OP SOLN
1.0000 [drp] | Freq: Every day | OPHTHALMIC | Status: DC
  Administered 2024-03-04 – 2024-03-06 (×3): 1 [drp] via OPHTHALMIC
  Filled 2024-03-04: qty 2.5

## 2024-03-04 MED ORDER — DIFLUPREDNATE 0.05 % OP EMUL: 1.0000 [drp] | Freq: Two times a day (BID) | OPHTHALMIC | Status: DC

## 2024-03-04 MED ORDER — HYDRALAZINE HCL 20 MG/ML IJ SOLN: 5.0000 mg | Freq: Four times a day (QID) | INTRAMUSCULAR | Status: DC | PRN

## 2024-03-04 MED ORDER — BRIMONIDINE TARTRATE 0.15 % OP SOLN
1.0000 [drp] | Freq: Three times a day (TID) | OPHTHALMIC | Status: DC
  Filled 2024-03-04: qty 5

## 2024-03-04 MED ORDER — SODIUM CHLORIDE 0.9% FLUSH
10.0000 mL | Freq: Two times a day (BID) | INTRAVENOUS | Status: DC
  Administered 2024-03-04 – 2024-03-07 (×2): 10 mL

## 2024-03-04 NOTE — Plan of Care (Signed)
   Problem: Education: Goal: Knowledge of General Education information will improve Description Including pain rating scale, medication(s)/side effects and non-pharmacologic comfort measures Outcome: Progressing   Problem: Health Behavior/Discharge Planning: Goal: Ability to manage health-related needs will improve Outcome: Progressing

## 2024-03-04 NOTE — TOC Initial Note (Signed)
 Transition of Care [facility]) - Initial/Assessment Note    Patient Details  Name: Lauren Bradford MRN: [id number] Date of Birth: [birth year]  Transition of Care [facility]) CM/SW Contact:    Bari Leys, RN Phone Number: 03/04/2024, 12:46 PM  Clinical Narrative:   Met with patient at bedside to introduce role of TOC/NCM and review for dc planning, pt confirmed she has an established PCP, no current home care services, patient is legally blind and reports she uses a cane as her mobility aid, reports she resides with her spouse and feels safe returning home. MOON explained, pt voiced understanding pt physically can not sign form secondary to patient is legally blind. NCM completed form, copy of form left at bedside. TOC will continue to follow.   Expected Discharge Plan: Home/Self Care Barriers to Discharge: Continued Medical Work up   Patient Goals and CMS Choice Patient states their goals for this hospitalization and ongoing recovery are:: return home          Expected Discharge Plan and Services       Living arrangements for the past 2 months: Single Family Home                                      Prior Living Arrangements/Services Living arrangements for the past 2 months: Single Family Home Lives with:: Relatives Patient language and need for interpreter reviewed:: Yes Do you feel safe going back to the place where you live?: Yes      Need for Family Participation in Patient Care: Yes (Comment) Care giver support system in place?: Yes (comment)   Criminal Activity/Legal Involvement Pertinent to Current Situation/Hospitalization: No - Comment as needed  Activities of Daily Living   ADL Screening (condition at time of admission) Independently performs ADLs?: Yes (appropriate for developmental age) Is the patient deaf or have difficulty hearing?: No Does the patient have difficulty seeing, even when wearing glasses/contacts?: No Does the patient have difficulty  concentrating, remembering, or making decisions?: No  Permission Sought/Granted                  Emotional Assessment Appearance:: Appears stated age Attitude/Demeanor/Rapport: Engaged Affect (typically observed): Accepting Orientation: : Oriented to Self, Oriented to Place, Oriented to  Time, Oriented to Situation Alcohol / Substance Use: Not Applicable Psych Involvement: No (comment)  Admission diagnosis:  Biliary colic [K80.50] Pancreatic cyst [K86.2] Adnexal cyst [N94.9] Abdominal pain [R10.9] Patient Active Problem List   Diagnosis Date Noted   Abdominal pain 03/03/2024   Hypertensive urgency 03/03/2024   Pancreatic lesion 03/03/2024   Type 2 diabetes mellitus (HCC)    Right ovarian cyst 07/21/2015   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 11/03/2014   Asthma 03/31/2013   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   BP (high blood pressure) 03/31/2013   Pseudophakia 12/17/2012   Primary open angle glaucoma 10/03/2012   PCP:  Danella Dunn, MD Pharmacy:   [id number] Jonette Nestle, [place] - [address] AT [facility] [address] & [facility] [address] [place] [address] Phone: [phone] Fax: [phone]     Social Drivers of Health (SDOH) Social History: SDOH Screenings   Food Insecurity: Patient Declined (03/03/2024)  Housing: Patient Declined (03/03/2024)  Transportation Needs: Patient Declined (03/03/2024)  Utilities: Patient Declined (03/03/2024)  Financial Resource Strain: Low Risk  (04/19/2018)   Received from [facility]  Physical Activity: Sufficiently Active (04/19/2018)   Received from [facility]  Social Connections: Patient Declined (03/03/2024)  Stress: No Stress Concern Present (04/19/2018)   Received from [facility]  Tobacco Use: Medium Risk (03/03/2024)   SDOH Interventions:     Readmission Risk Interventions     No data to display

## 2024-03-04 NOTE — Care Management Obs Status (Signed)
 MEDICARE OBSERVATION STATUS NOTIFICATION   Patient Details  Name: Lauren Bradford MRN: [id number] Date of Birth: 12/03/1975   Medicare Observation Status Notification Given:  Yes    Bari Leys, RN 03/04/2024, 12:41 PM

## 2024-03-04 NOTE — Progress Notes (Signed)
 NP Sharion Davidson was notified that patient is complaining of abdominal pain 5/10. She is currently NPO d/t HIDA scan and can't have narcotics.

## 2024-03-04 NOTE — Hospital Course (Addendum)
 48 year old woman PMH asthma, glaucoma, blindness, diabetes mellitus type 2, presented with epigastric abdominal pain.  Also reports chronic right lower quadrant pain for some time now.  Clinical evaluation suggested cholecystitis although imaging was equivocal.  Subsequent HIDA scan positive for cholecystitis.  General surgery following.  Right adnexal mass also noted, ROMA lab in process.  Conferred with GYN per surgery request, likely just outpatient follow-up but surgery can call GYN perioperatively.  Consultants General surgery Discussed w/ GYN by phone  Procedures/Events

## 2024-03-04 NOTE — Progress Notes (Signed)

## 2024-03-04 NOTE — Progress Notes (Signed)
 Progress Note   Patient: Lauren Bradford ZOX:[id number] DOB: September 11, 1976 DOA: 03/03/2024     0 DOS: the patient was seen and examined on 03/04/2024   Brief hospital course: 48 year old woman PMH asthma, glaucoma, blindness, diabetes mellitus type 2, presented with epigastric abdominal pain.  Also reports chronic right lower quadrant pain for some time now.  Clinical evaluation suggested cholecystitis although imaging was equivocal.  Subsequent HIDA scan positive for cholecystitis.  General surgery following.  Right adnexal mass also noted, ROMA lab in process.  Conferred with GYN per surgery request, likely just outpatient follow-up but surgery can call GYN perioperatively.  Consultants General surgery Discussed w/ GYN by phone  Procedures/Events   Assessment and Plan: Acute cholecystitis Epigastric and right upper quadrant pain Pain is worse with eating.  LFTs and lipase unremarkable. CT abdomen pelvis showed right adnexal abnormalities, gallbladder was ill-defined but there was concern for cholecystitis. HIDA scan most consistent with cystic duct obstruction, acute cholecystitis. Management as per general surgery  Right adnexal mass EDP discussed with GYN on admission, I discussed also with GYN 5/27 the request of general surgery. ROMA in process.  Neurosurgery can call on-call GYN tomorrow perioperatively to see if they are available to participate in the surgery. GYN at this time requests outpatient follow-up   Progression of retroperitoneal and right common iliac adenopathy Features nonspecific and could be reactive.  Lymphoproliferative disorder not excluded. Close follow-up recommended.  Pancreatic cystic lesion CT showed stable 2.4 cm well-defined homogeneous cystic lesion arising from the uncinate process of the pancreas. Imaging features are nonspecific but could be related to a side branch IPMN. Follow-up outpatient nonemergent MRI abdomen with and without contrast recommended  to further evaluate.  Hypertension Stable.   Type II DM  CBG variable.  Sliding scale insulin .  May need low-dose long-acting (on long-acting at home).  For now we will monitor on sliding scale.   Asthma  Appears stable.  Glaucoma Blind in 1 eye, tunnel vision in the other Continue eyedrops  Aortic Atherosclerosis     Subjective:  Reports longstanding epigastric and right upper quadrant pain which is worse after eating.  Also reports right lower quadrant pain which has been present for some time.  Does not have a GYN.  Physical Exam: Vitals:   03/04/24 0903 03/04/24 1150 03/04/24 1236 03/04/24 1530  BP: (!) 157/84 (!) 155/97 (!) 150/95 (!) 162/97  Pulse: 95  (!) 101 97  Resp:  12 17 16   Temp:    98.7 F (37.1 C)  TempSrc:    Oral  SpO2: 99% 96% 98% 97%  Weight:      Height:       Physical Exam Vitals reviewed.  Constitutional:      General: She is not in acute distress.    Appearance: She is not ill-appearing or toxic-appearing.  Cardiovascular:     Rate and Rhythm: Normal rate and regular rhythm.     Heart sounds: No murmur heard. Pulmonary:     Effort: Pulmonary effort is normal. No respiratory distress.     Breath sounds: No wheezing, rhonchi or rales.  Neurological:     Mental Status: She is alert.  Psychiatric:        Mood and Affect: Mood normal.        Behavior: Behavior normal.     Data Reviewed: CBG stable Potassium 3.4 remainder CMP unremarkable CBC within normal limits Hemoglobin A1c 8.0  Family Communication: none  Disposition: Status is: Observation  Time spent: 35 minutes  Author: Jerline Moon, MD 03/04/2024 4:21 PM  For on call review [email or web address].

## 2024-03-05 ENCOUNTER — Observation Stay (HOSPITAL_COMMUNITY): Admitting: Anesthesiology

## 2024-03-05 ENCOUNTER — Other Ambulatory Visit: Payer: Self-pay

## 2024-03-05 ENCOUNTER — Encounter (HOSPITAL_COMMUNITY): Payer: Self-pay | Admitting: Family Medicine

## 2024-03-05 ENCOUNTER — Encounter (HOSPITAL_COMMUNITY)
Admission: EM | Disposition: A | Discharge status: Home / Self Care | Payer: Self-pay | Source: Home / Self Care | Attending: Emergency Medicine

## 2024-03-05 DIAGNOSIS — K8000 Calculus of gallbladder with acute cholecystitis without obstruction: Secondary | ICD-10-CM | POA: Diagnosis not present

## 2024-03-05 DIAGNOSIS — K8001 Calculus of gallbladder with acute cholecystitis with obstruction: Secondary | ICD-10-CM | POA: Diagnosis not present

## 2024-03-05 DIAGNOSIS — K810 Acute cholecystitis: Secondary | ICD-10-CM | POA: Diagnosis not present

## 2024-03-05 HISTORY — PX: CHOLECYSTECTOMY: SHX55

## 2024-03-05 LAB — COMPREHENSIVE METABOLIC PANEL WITH GFR
ALT: 14 U/L (ref 0–44)
AST: 13 U/L — ABNORMAL LOW (ref 15–41)
Albumin: 3.1 g/dL — ABNORMAL LOW (ref 3.5–5.0)
Alkaline Phosphatase: 107 U/L (ref 38–126)
Anion gap: 9 (ref 5–15)
BUN: 6 mg/dL (ref 6–20)
CO2: 27 mmol/L (ref 22–32)
Calcium: 8.5 mg/dL — ABNORMAL LOW (ref 8.9–10.3)
Chloride: 97 mmol/L — ABNORMAL LOW (ref 98–111)
Creatinine, Ser: 0.59 mg/dL (ref 0.44–1.00)
GFR, Estimated: >60 mL/min (ref >60)
Glucose, Bld: 177 mg/dL — ABNORMAL HIGH (ref 70–99)
Potassium: 3.2 mmol/L — ABNORMAL LOW (ref 3.5–5.1)
Sodium: 133 mmol/L — ABNORMAL LOW (ref 135–145)
Total Bilirubin: 0.6 mg/dL (ref 0.0–1.2)
Total Protein: 7 g/dL (ref 6.5–8.1)

## 2024-03-05 LAB — GLUCOSE, CAPILLARY
Glucose-Capillary: 142 mg/dL — ABNORMAL HIGH (ref 70–99)
Glucose-Capillary: 157 mg/dL — ABNORMAL HIGH (ref 70–99)
Glucose-Capillary: 165 mg/dL — ABNORMAL HIGH (ref 70–99)
Glucose-Capillary: 171 mg/dL — ABNORMAL HIGH (ref 70–99)
Glucose-Capillary: 189 mg/dL — ABNORMAL HIGH (ref 70–99)
Glucose-Capillary: 215 mg/dL — ABNORMAL HIGH (ref 70–99)
Glucose-Capillary: 216 mg/dL — ABNORMAL HIGH (ref 70–99)

## 2024-03-05 LAB — CBC
HCT: 34.8 % — ABNORMAL LOW (ref 36.0–46.0)
Hemoglobin: 11.6 g/dL — ABNORMAL LOW (ref 12.0–15.0)
MCH: 29.4 pg (ref 26.0–34.0)
MCHC: 33.3 g/dL (ref 30.0–36.0)
MCV: 88.1 fL (ref 80.0–100.0)
Platelets: 271 10*3/uL (ref 150–400)
RBC: 3.95 MIL/uL (ref 3.87–5.11)
RDW: 13 % (ref 11.5–15.5)
WBC: 7.8 10*3/uL (ref 4.0–10.5)
nRBC: 0 % (ref 0.0–0.2)

## 2024-03-05 LAB — PREMENOPAUSAL INTERP: LOW

## 2024-03-05 LAB — OVARIAN MALIGNANCY RISK-ROMA
Cancer Antigen (CA) 125: 6.1 U/mL (ref 0.0–38.1)
HE4: 35.5 pmol/L (ref 0.0–105.2)
Postmenopausal ROMA: 0.45
Premenopausal ROMA: 0.33

## 2024-03-05 LAB — POSTMENOPAUSAL INTERP: LOW

## 2024-03-05 SURGERY — LAPAROSCOPIC CHOLECYSTECTOMY WITH INTRAOPERATIVE CHOLANGIOGRAM
Anesthesia: General

## 2024-03-05 MED ORDER — CHLORHEXIDINE GLUCONATE 0.12 % MT SOLN
15.0000 mL | Freq: Once | OROMUCOSAL | Status: AC
  Administered 2024-03-05: 15 mL via OROMUCOSAL

## 2024-03-05 MED ORDER — LIDOCAINE HCL (CARDIAC) PF 100 MG/5ML IV SOSY
PREFILLED_SYRINGE | INTRAVENOUS | Status: DC | PRN
  Administered 2024-03-05: 100 mg via INTRATRACHEAL

## 2024-03-05 MED ORDER — STERILE WATER FOR IRRIGATION IR SOLN
Status: DC | PRN
  Administered 2024-03-05: 1000 mL

## 2024-03-05 MED ORDER — FENTANYL CITRATE (PF) 100 MCG/2ML IJ SOLN
INTRAMUSCULAR | Status: AC
Start: 2024-03-05 — End: ?
  Filled 2024-03-05: qty 2

## 2024-03-05 MED ORDER — LACTATED RINGERS IV SOLN: INTRAVENOUS | Status: DC | PRN

## 2024-03-05 MED ORDER — OXYCODONE HCL 5 MG PO TABS: 5.0000 mg | ORAL_TABLET | Freq: Once | ORAL | Status: DC | PRN

## 2024-03-05 MED ORDER — PHENYLEPHRINE 80 MCG/ML (10ML) SYRINGE FOR IV PUSH (FOR BLOOD PRESSURE SUPPORT)
PREFILLED_SYRINGE | INTRAVENOUS | Status: AC
Start: 2024-03-05 — End: ?
  Filled 2024-03-05: qty 10

## 2024-03-05 MED ORDER — MORPHINE SULFATE (PF) 2 MG/ML IV SOLN: 2.0000 mg | INTRAVENOUS | Status: DC | PRN

## 2024-03-05 MED ORDER — BUPIVACAINE-EPINEPHRINE 0.5% -1:200000 IJ SOLN
INTRAMUSCULAR | Status: DC | PRN
Start: 2024-03-05 — End: 2024-03-05
  Administered 2024-03-05: 10 mL

## 2024-03-05 MED ORDER — ACETAMINOPHEN 500 MG PO TABS
1000.0000 mg | ORAL_TABLET | Freq: Four times a day (QID) | ORAL | Status: DC
  Administered 2024-03-05 – 2024-03-07 (×6): 1000 mg via ORAL
  Filled 2024-03-05 (×7): qty 2

## 2024-03-05 MED ORDER — ROCURONIUM BROMIDE 100 MG/10ML IV SOLN
INTRAVENOUS | Status: DC | PRN
Start: 2024-03-05 — End: 2024-03-05
  Administered 2024-03-05: 60 mg via INTRAVENOUS

## 2024-03-05 MED ORDER — MIDAZOLAM HCL 2 MG/2ML IJ SOLN
INTRAMUSCULAR | Status: DC | PRN
  Administered 2024-03-05: 2 mg via INTRAVENOUS

## 2024-03-05 MED ORDER — PROPOFOL 10 MG/ML IV BOLUS
INTRAVENOUS | Status: AC
  Filled 2024-03-05: qty 20

## 2024-03-05 MED ORDER — OXYCODONE HCL 5 MG/5ML PO SOLN: 5.0000 mg | Freq: Once | ORAL | Status: DC | PRN

## 2024-03-05 MED ORDER — FENTANYL CITRATE PF 50 MCG/ML IJ SOSY: 25.0000 ug | PREFILLED_SYRINGE | INTRAMUSCULAR | Status: DC | PRN

## 2024-03-05 MED ORDER — HEMOSTATIC AGENTS (NO CHARGE) OPTIME
TOPICAL | Status: DC | PRN
  Administered 2024-03-05: 1

## 2024-03-05 MED ORDER — BUPIVACAINE-EPINEPHRINE (PF) 0.5% -1:200000 IJ SOLN
INTRAMUSCULAR | Status: AC
  Filled 2024-03-05: qty 30

## 2024-03-05 MED ORDER — ONDANSETRON HCL 4 MG/2ML IJ SOLN
INTRAMUSCULAR | Status: DC | PRN
  Administered 2024-03-05: 4 mg via INTRAVENOUS

## 2024-03-05 MED ORDER — CEFAZOLIN SODIUM-DEXTROSE 2-4 GM/100ML-% IV SOLN
INTRAVENOUS | Status: AC
  Filled 2024-03-05: qty 100

## 2024-03-05 MED ORDER — ROCURONIUM BROMIDE 10 MG/ML (PF) SYRINGE
PREFILLED_SYRINGE | INTRAVENOUS | Status: AC
  Filled 2024-03-05: qty 10

## 2024-03-05 MED ORDER — ACETAMINOPHEN 500 MG PO TABS
1000.0000 mg | ORAL_TABLET | Freq: Once | ORAL | Status: AC
  Administered 2024-03-05: 1000 mg via ORAL
  Filled 2024-03-05: qty 2

## 2024-03-05 MED ORDER — LIDOCAINE HCL (PF) 2 % IJ SOLN
INTRAMUSCULAR | Status: AC
  Filled 2024-03-05: qty 5

## 2024-03-05 MED ORDER — METHOCARBAMOL 500 MG PO TABS
500.0000 mg | ORAL_TABLET | Freq: Four times a day (QID) | ORAL | Status: DC | PRN
  Administered 2024-03-07: 500 mg via ORAL
  Filled 2024-03-05: qty 1

## 2024-03-05 MED ORDER — ONDANSETRON HCL 4 MG/2ML IJ SOLN
INTRAMUSCULAR | Status: AC
  Filled 2024-03-05: qty 2

## 2024-03-05 MED ORDER — CHLORHEXIDINE GLUCONATE CLOTH 2 % EX PADS: 6.0000 | MEDICATED_PAD | Freq: Once | CUTANEOUS | Status: DC

## 2024-03-05 MED ORDER — PHENOL 1.4 % MT LIQD
1.0000 | OROMUCOSAL | Status: DC | PRN
  Administered 2024-03-05: 1 via OROMUCOSAL
  Filled 2024-03-05: qty 177

## 2024-03-05 MED ORDER — OXYCODONE HCL 5 MG PO TABS
5.0000 mg | ORAL_TABLET | ORAL | Status: DC | PRN
  Administered 2024-03-05 – 2024-03-06 (×2): 5 mg via ORAL
  Administered 2024-03-06: 10 mg via ORAL
  Administered 2024-03-06: 5 mg via ORAL
  Administered 2024-03-07: 10 mg via ORAL
  Filled 2024-03-05: qty 2
  Filled 2024-03-05: qty 1
  Filled 2024-03-05 (×2): qty 2
  Filled 2024-03-05: qty 1

## 2024-03-05 MED ORDER — ACETAMINOPHEN 10 MG/ML IV SOLN: 1000.0000 mg | Freq: Once | INTRAVENOUS | Status: DC | PRN

## 2024-03-05 MED ORDER — PROPOFOL 10 MG/ML IV BOLUS
INTRAVENOUS | Status: DC | PRN
  Administered 2024-03-05: 200 mg via INTRAVENOUS

## 2024-03-05 MED ORDER — CEFAZOLIN SODIUM-DEXTROSE 2-4 GM/100ML-% IV SOLN
2.0000 g | INTRAVENOUS | Status: AC
  Administered 2024-03-05: 2 g via INTRAVENOUS
  Filled 2024-03-05: qty 100

## 2024-03-05 MED ORDER — LACTATED RINGERS IR SOLN
Status: DC | PRN
  Administered 2024-03-05: 1000 mL

## 2024-03-05 MED ORDER — AMISULPRIDE (ANTIEMETIC) 5 MG/2ML IV SOLN
10.0000 mg | Freq: Once | INTRAVENOUS | Status: DC | PRN
Start: 2024-03-05 — End: 2024-03-05

## 2024-03-05 MED ORDER — ORAL CARE MOUTH RINSE: 15.0000 mL | Freq: Once | OROMUCOSAL | Status: AC

## 2024-03-05 MED ORDER — INSULIN ASPART 100 UNIT/ML IJ SOLN
0.0000 [IU] | INTRAMUSCULAR | Status: DC | PRN
  Administered 2024-03-05: 2 [IU] via SUBCUTANEOUS
  Filled 2024-03-05: qty 1

## 2024-03-05 MED ORDER — FENTANYL CITRATE (PF) 100 MCG/2ML IJ SOLN
INTRAMUSCULAR | Status: DC | PRN
  Administered 2024-03-05: 100 ug via INTRAVENOUS
  Administered 2024-03-05: 50 ug via INTRAVENOUS

## 2024-03-05 MED ORDER — DEXAMETHASONE SODIUM PHOSPHATE 10 MG/ML IJ SOLN
INTRAMUSCULAR | Status: AC
  Filled 2024-03-05: qty 1

## 2024-03-05 MED ORDER — CEFAZOLIN SODIUM-DEXTROSE 2-4 GM/100ML-% IV SOLN
2.0000 g | Freq: Three times a day (TID) | INTRAVENOUS | Status: DC
  Administered 2024-03-05 – 2024-03-07 (×5): 2 g via INTRAVENOUS
  Filled 2024-03-05 (×6): qty 100

## 2024-03-05 MED ORDER — LACTATED RINGERS IV SOLN: INTRAVENOUS | Status: DC

## 2024-03-05 MED ORDER — 0.9 % SODIUM CHLORIDE (POUR BTL) OPTIME
TOPICAL | Status: DC | PRN
  Administered 2024-03-05: 1000 mL

## 2024-03-05 MED ORDER — MIDAZOLAM HCL 2 MG/2ML IJ SOLN
INTRAMUSCULAR | Status: AC
  Filled 2024-03-05: qty 2

## 2024-03-05 SURGICAL SUPPLY — 29 items
BAG COUNTER SPONGE SURGICOUNT (BAG) IMPLANT
CABLE HIGH FREQUENCY MONO STRZ (ELECTRODE) ×1 IMPLANT
CHLORAPREP W/TINT 26 (MISCELLANEOUS) ×2 IMPLANT
CLIP APPLIE ROT 10 11.4 M/L (STAPLE) ×1 IMPLANT
COVER MAYO STAND XLG (MISCELLANEOUS) ×1 IMPLANT
COVER SURGICAL LIGHT HANDLE (MISCELLANEOUS) ×1 IMPLANT
DERMABOND ADVANCED .7 DNX12 (GAUZE/BANDAGES/DRESSINGS) IMPLANT
DRAPE C-ARM 42X120 X-RAY (DRAPES) ×1 IMPLANT
ELECT REM PT RETURN 15FT ADLT (MISCELLANEOUS) ×1 IMPLANT
GAUZE SPONGE 2X2 8PLY STRL LF (GAUZE/BANDAGES/DRESSINGS) ×1 IMPLANT
GLOVE SURG ORTHO 8.0 STRL STRW (GLOVE) ×1 IMPLANT
GLOVE SURG SYN 7.5 E (GLOVE) ×2 IMPLANT
GLOVE SURG SYN 7.5 PF PI (GLOVE) ×2 IMPLANT
GOWN STRL REUS W/ TWL XL LVL3 (GOWN DISPOSABLE) ×1 IMPLANT
IRRIGATION SUCT STRKRFLW 2 WTP (MISCELLANEOUS) ×1 IMPLANT
KIT BASIN OR (CUSTOM PROCEDURE TRAY) ×1 IMPLANT
KIT TURNOVER KIT A (KITS) IMPLANT
SCISSORS LAP 5X35 DISP (ENDOMECHANICALS) ×1 IMPLANT
SET CHOLANGIOGRAPH MIX (MISCELLANEOUS) ×1 IMPLANT
SET TUBE SMOKE EVAC HIGH FLOW (TUBING) ×1 IMPLANT
SLEEVE Z-THREAD 5X100MM (TROCAR) ×1 IMPLANT
SPIKE FLUID TRANSFER (MISCELLANEOUS) ×1 IMPLANT
SUT MNCRL AB 4-0 PS2 18 (SUTURE) ×1 IMPLANT
SYSTEM BAG RETRIEVAL 10MM (BASKET) ×1 IMPLANT
TOWEL OR 17X26 10 PK STRL BLUE (TOWEL DISPOSABLE) ×1 IMPLANT
TRAY LAPAROSCOPIC (CUSTOM PROCEDURE TRAY) ×1 IMPLANT
TROCAR 11X100 Z THREAD (TROCAR) ×1 IMPLANT
TROCAR BALLN 12MMX100 BLUNT (TROCAR) ×1 IMPLANT
TROCAR Z-THREAD OPTICAL 5X100M (TROCAR) ×1 IMPLANT

## 2024-03-05 NOTE — Progress Notes (Signed)
 PROGRESS NOTE    Lauren Bradford  QIH:[id number] DOB: 10-27-1975 DOA: 03/03/2024 PCP: Danella Dunn, MD   Brief Narrative:  48 year old woman with history of of asthma, glaucoma, blindness, diabetes mellitus type 2, chronic right lower pain with acute onset epigastric pain, right upper quadrant.  Imaging at intake concerning for adnexal mass as well as cholecystitis given HIDA scan, general surgery called in consult.  OB/GYN also called in consult recommending outpatient follow-up.  Assessment & Plan:   Principal Problem:   Acute cholecystitis Active Problems:   Asthma   Right ovarian cyst   Pancreatic lesion   Type 2 diabetes mellitus (HCC)   Glaucoma   Aortic atherosclerosis (HCC)   Acute cholecystitis Epigastric and right upper quadrant pain Confirmed on CT/HIDA scan General Surgery following, plan for laparoscopic cholecystectomy later today Pain currently well-controlled, advance diet postoperatively per general surgery   Right adnexal mass Multiple specialties involved including gynecology, neurosurgery, currently recommending further outpatient follow-up for further evaluation, imaging and scheduling   Progression of retroperitoneal and right common iliac adenopathy Nonspecific, lymphoproliferative disorder could not be confirmed or ruled out -follow-up outpatient as discussed   Pancreatic cystic lesion CT showed stable 2.4 cm well-defined homogeneous cystic lesion arising from the uncinate process of the pancreas. Imaging features are nonspecific -currently recommending nonemergent MRI abdomen with and without contrast in the outpatient setting.   Hypertension Stable.   Type II DM, uncontrolled with hyperglycemia Continue sliding scale insulin , hypoglycemic protocol  Unclear the patient will need insulin  ongoing given minimal usage over the past 24 hours Lengthy discussion in regards to patient's lifestyle and dietary changes.   Asthma  Appears stable.  Not  currently in exacerbation   Glaucoma Legally blind, continue home eyedrops   Aortic Atherosclerosis  Does not appear to be on antiplatelet or statin, defer to PCP  DVT prophylaxis: SCDs Start: 03/03/24 1804 Code Status:   Code Status: Full Code Family Communication: None present  Status is: Inpatient  Dispo: The patient is from: Home              Anticipated d/c is to: Home              Anticipated d/c date is: 24 to 48 hours              Patient currently not medically stable for discharge  Consultants:  General Surgery  Procedures:  Laparoscopic cholecystectomy  Antimicrobials:  Perioperatively  Subjective: No acute issues or events overnight denies nausea vomiting diarrhea constipation headache fevers chills or chest pain  Objective: Vitals:   03/04/24 1236 03/04/24 1530 03/04/24 2005 03/05/24 0427  BP: (!) 150/95 (!) 162/97 (!) 153/95 (!) 134/94  Pulse: (!) 101 97 98 98  Resp: 17 16 18 16   Temp:  98.7 F (37.1 C) 98.4 F (36.9 C) 98.4 F (36.9 C)  TempSrc:  Oral Oral Oral  SpO2: 98% 97% 95% 95%  Weight:      Height:        Intake/Output Summary (Last 24 hours) at 03/05/2024 0809 Last data filed at 03/05/2024 0200 Gross per 24 hour  Intake 1261.29 ml  Output 1150 ml  Net 111.29 ml   Filed Weights   03/03/24 0939  Weight: 104.8 kg    Examination:  General:  Pleasantly resting in bed, No acute distress. HEENT:  Normocephalic atraumatic.  Sclerae nonicteric, noninjected.  Extraocular movements intact bilaterally. Neck:  Without mass or deformity.  Trachea is midline. Lungs:  Clear to auscultate  bilaterally without rhonchi, wheeze, or rales. Heart:  Regular rate and rhythm.  Without murmurs, rubs, or gallops. Abdomen:  Soft, nontender, nondistended.  Without guarding or rebound. Extremities: Without cyanosis, clubbing, edema, or obvious deformity. Skin:  Warm and dry, no erythema.   Data Reviewed: I have personally reviewed following labs and  imaging studies  CBC: Recent Labs  Lab 03/03/24 0844 03/04/24 0422 03/05/24 0215  WBC 6.2 7.5 7.8  NEUTROABS 4.4  --   --   HGB 11.7* 12.2 11.6*  HCT 35.9* 36.3 34.8*  MCV 87.1 86.8 88.1  PLT 312 301 271   Basic Metabolic Panel: Recent Labs  Lab 03/03/24 0844 03/04/24 0422 03/05/24 0215  NA 132* 134* 133*  K 3.5 3.4* 3.2*  CL 96* 97* 97*  CO2 26 25 27   GLUCOSE 242* 205* 177*  BUN 12 6 6   CREATININE 0.84 0.57 0.59  CALCIUM 9.2 9.1 8.5*   GFR: Estimated Creatinine Clearance: 101.1 mL/min (by C-G formula based on SCr of 0.59 mg/dL). Liver Function Tests: Recent Labs  Lab 03/03/24 0844 03/04/24 0422 03/05/24 0215  AST 15 15 13*  ALT 18 16 14   ALKPHOS 121 113 107  BILITOT 0.7 0.7 0.6  PROT 7.7 7.0 7.0  ALBUMIN 3.8 3.4* 3.1*   Recent Labs  Lab 03/03/24 0844  LIPASE 34   HbA1C: Recent Labs    03/04/24 0422  HGBA1C 8.0*   CBG: Recent Labs  Lab 03/04/24 1527 03/04/24 2003 03/05/24 0024 03/05/24 0424 03/05/24 0727  GLUCAP 217* 161* 157* 165* 142*   No results found for this or any previous visit (from the past 240 hours).   Radiology Studies: NM Hepatobiliary Liver Func Result Date: 03/04/2024 CLINICAL DATA:  RIGHT upper quadrant pain.  Cholelithiasis. EXAM: NUCLEAR MEDICINE HEPATOBILIARY IMAGING TECHNIQUE: Sequential images of the abdomen were obtained out to 60 minutes following intravenous administration of radiopharmaceutical. RADIOPHARMACEUTICALS:  5.3 mCi Tc-59m  Choletec IV COMPARISON:  Ultrasound CT 03/04/2019 FINDINGS: Prompt clearance radiotracer from blood pool and homogeneous uptake in liver. Counts are evident in the common bile duct and small bowel by 40 minutes. The gallbladder fails to fill at 45 minutes. 3 mg IV morphine  was administered to augment filling of the gallbladder. No filling of the gallbladder post morphine  augmentation. IMPRESSION: Non filling of the gallbladder most consistent with cystic duct obstruction / acute  cholecystitis. These results will be called to the ordering clinician or representative by the Radiologist Assistant, and communication documented in the PACS or [employer]. Electronically Signed   By: Deboraha Fallow M.D.   On: 03/04/2024 14:30   US  Pelvis Complete Result Date: 03/03/2024 CLINICAL DATA:  Right-sided abdominal and pelvic pain. Right adnexal cystic lesion on recent CT. EXAM: TRANSABDOMINAL AND TRANSVAGINAL ULTRASOUND OF PELVIS DOPPLER ULTRASOUND OF OVARIES TECHNIQUE: Both transabdominal and transvaginal ultrasound examinations of the pelvis were performed. Transabdominal technique was performed for global imaging of the pelvis including uterus, ovaries, adnexal regions, and pelvic cul-de-sac. It was necessary to proceed with endovaginal exam following the transabdominal exam to visualize the right adnexal lesion. Color and duplex Doppler ultrasound was utilized to evaluate blood flow to the ovaries. COMPARISON:  CT on 03/03/2024 FINDINGS: Uterus Measurements: 7.2 x 4.5 x 5.2 cm = volume: 89 mL. Diffusely heterogeneous myometrial echotexture is seen, without discrete myometrial masses. Endometrium Thickness: 7 mm. Right ovary Measurements: No normal ovary visualized. A complex cystic lesion is seen in the right adnexa measuring 6.6 x 4.1 x 4.8 cm. This lesion has  multiple internal septations, some of which show mild thickening and internal blood flow on color and duplex Doppler. Differential diagnosis includes cystic ovarian neoplasm, hydro-pyo-salpinx, and endometrioma. Left ovary Measurements: 2.3 x 2.2 x 2.1 cm = volume: 5.5 mL. Normal appearance/no adnexal mass. Pulsed Doppler evaluation of both ovaries demonstrates normal low-resistance arterial and venous waveforms. Other findings No abnormal free fluid. IMPRESSION: 6.6 cm complex cystic lesion in the right adnexa, with multiple mildly thickened septations but no soft tissue component. Differential diagnosis includes cystic ovarian  neoplasm, however, hydrosalpinx, and endometrioma. No sonographic evidence for ovarian torsion. Electronically Signed   By: Marlyce Sine M.D.   On: 03/03/2024 13:38   US  Transvaginal Non-OB Result Date: 03/03/2024 CLINICAL DATA:  Right-sided abdominal and pelvic pain. Right adnexal cystic lesion on recent CT. EXAM: TRANSABDOMINAL AND TRANSVAGINAL ULTRASOUND OF PELVIS DOPPLER ULTRASOUND OF OVARIES TECHNIQUE: Both transabdominal and transvaginal ultrasound examinations of the pelvis were performed. Transabdominal technique was performed for global imaging of the pelvis including uterus, ovaries, adnexal regions, and pelvic cul-de-sac. It was necessary to proceed with endovaginal exam following the transabdominal exam to visualize the right adnexal lesion. Color and duplex Doppler ultrasound was utilized to evaluate blood flow to the ovaries. COMPARISON:  CT on 03/03/2024 FINDINGS: Uterus Measurements: 7.2 x 4.5 x 5.2 cm = volume: 89 mL. Diffusely heterogeneous myometrial echotexture is seen, without discrete myometrial masses. Endometrium Thickness: 7 mm. Right ovary Measurements: No normal ovary visualized. A complex cystic lesion is seen in the right adnexa measuring 6.6 x 4.1 x 4.8 cm. This lesion has multiple internal septations, some of which show mild thickening and internal blood flow on color and duplex Doppler. Differential diagnosis includes cystic ovarian neoplasm, hydro-pyo-salpinx, and endometrioma. Left ovary Measurements: 2.3 x 2.2 x 2.1 cm = volume: 5.5 mL. Normal appearance/no adnexal mass. Pulsed Doppler evaluation of both ovaries demonstrates normal low-resistance arterial and venous waveforms. Other findings No abnormal free fluid. IMPRESSION: 6.6 cm complex cystic lesion in the right adnexa, with multiple mildly thickened septations but no soft tissue component. Differential diagnosis includes cystic ovarian neoplasm, however, hydrosalpinx, and endometrioma. No sonographic evidence for ovarian  torsion. Electronically Signed   By: Marlyce Sine M.D.   On: 03/03/2024 13:38   US  Art/Ven Flow Abd Pelv Doppler Result Date: 03/03/2024 CLINICAL DATA:  Right-sided abdominal and pelvic pain. Right adnexal cystic lesion on recent CT. EXAM: TRANSABDOMINAL AND TRANSVAGINAL ULTRASOUND OF PELVIS DOPPLER ULTRASOUND OF OVARIES TECHNIQUE: Both transabdominal and transvaginal ultrasound examinations of the pelvis were performed. Transabdominal technique was performed for global imaging of the pelvis including uterus, ovaries, adnexal regions, and pelvic cul-de-sac. It was necessary to proceed with endovaginal exam following the transabdominal exam to visualize the right adnexal lesion. Color and duplex Doppler ultrasound was utilized to evaluate blood flow to the ovaries. COMPARISON:  CT on 03/03/2024 FINDINGS: Uterus Measurements: 7.2 x 4.5 x 5.2 cm = volume: 89 mL. Diffusely heterogeneous myometrial echotexture is seen, without discrete myometrial masses. Endometrium Thickness: 7 mm. Right ovary Measurements: No normal ovary visualized. A complex cystic lesion is seen in the right adnexa measuring 6.6 x 4.1 x 4.8 cm. This lesion has multiple internal septations, some of which show mild thickening and internal blood flow on color and duplex Doppler. Differential diagnosis includes cystic ovarian neoplasm, hydro-pyo-salpinx, and endometrioma. Left ovary Measurements: 2.3 x 2.2 x 2.1 cm = volume: 5.5 mL. Normal appearance/no adnexal mass. Pulsed Doppler evaluation of both ovaries demonstrates normal low-resistance arterial and venous waveforms. Other findings  No abnormal free fluid. IMPRESSION: 6.6 cm complex cystic lesion in the right adnexa, with multiple mildly thickened septations but no soft tissue component. Differential diagnosis includes cystic ovarian neoplasm, however, hydrosalpinx, and endometrioma. No sonographic evidence for ovarian torsion. Electronically Signed   By: Marlyce Sine M.D.   On: 03/03/2024 13:38    US  Abdomen Limited RUQ (LIVER/GB) Result Date: 03/03/2024 CLINICAL DATA:  Right upper quadrant pain for a day EXAM: ULTRASOUND ABDOMEN LIMITED RIGHT UPPER QUADRANT COMPARISON:  CT 03/02/2024. FINDINGS: Gallbladder: Distended gallbladder with layering stones. No wall thickening or adjacent fluid. No reported sonographic Murphy's sign. Common bile duct: Diameter: 3 mm Liver: Diffusely echogenic hepatic parenchyma consistent with fatty liver infiltration. Portal vein is patent on color Doppler imaging with normal direction of blood flow towards the liver. Other: Evaluation limited by overlapping bowel gas and soft tissue. IMPRESSION: Distended gallbladder with stones. No further sonographic evidence of acute cholecystitis. No ductal dilatation. Fatty liver infiltration. Electronically Signed   By: Adrianna Horde M.D.   On: 03/03/2024 13:22   CT ABDOMEN PELVIS W CONTRAST Result Date: 03/03/2024 CLINICAL DATA:  Abdominal pain. EXAM: CT ABDOMEN AND PELVIS WITH CONTRAST TECHNIQUE: Multidetector CT imaging of the abdomen and pelvis was performed using the standard protocol following bolus administration of intravenous contrast. RADIATION DOSE REDUCTION: This exam was performed according to the departmental dose-optimization program which includes automated exposure control, adjustment of the mA and/or kV according to patient size and/or use of iterative reconstruction technique. CONTRAST:  OMNIPAQUE  IOHEXOL  300 MG/ML  SOLN COMPARISON:  08/12/2023 FINDINGS: Lower chest: Dependent atelectasis. Hepatobiliary: No suspicious focal abnormality within the liver parenchyma. Similar appearance of subcapsular low-density in the medial segment left liver, likely due to focal fatty deposition. Areas of fatty sparing seen along the subcapsular liver adjacent to the gallbladder fossa. Gallbladder wall is ill-defined with subtle pericholecystic edema. No calcified gallstones evident. No intrahepatic or extrahepatic biliary  dilation. Pancreas: 2.4 cm well-defined homogeneous cystic lesion arising from the uncinate process of the pancreas is stable. No main pancreatic duct dilatation. Spleen: No splenomegaly. No suspicious focal mass lesion. Adrenals/Urinary Tract: No adrenal nodule or mass. Kidneys unremarkable. No evidence for hydroureter. The urinary bladder appears normal for the degree of distention. Stomach/Bowel: Stomach is unremarkable. No gastric wall thickening. No evidence of outlet obstruction. Duodenum is normally positioned as is the ligament of Treitz. No small bowel wall thickening. No small bowel dilatation. The terminal ileum is normal. The appendix is normal. No gross colonic mass. No colonic wall thickening. Mild left colonic diverticulosis without diverticulitis. Vascular/Lymphatic: There is mild atherosclerotic calcification of the abdominal aorta without aneurysm. Slight progression of gastrohepatic and hepatoduodenal ligament lymph nodes with 12 mm short axis HDL lymph node seen on 26/3 retrocrural low-density nodules again noted. The precaval node measured previously at 12 mm short axis is 14 mm today on 51/3. 15 mm short axis right common iliac node measured previously is 16 mm today on 63/3. Stranding is identified in the retroperitoneal fat around the aorta and IVC extending into the extraperitoneal fat along the common iliac vessels bilaterally. Reproductive: The uterus is unremarkable. No left adnexal mass. Interval development of a 5.2 x 4.6 x 6.3 cm mixed attenuation lesion in the right adnexal space potentially representing poly cystic change in the ovary versus a dilated tortuous right fallopian tube. Tubal ligation clips are noted bilaterally. Other: No intraperitoneal free fluid. Musculoskeletal: No worrisome lytic or sclerotic osseous abnormality. IMPRESSION: 1. Interval development of a 5.2  x 4.6 x 6.3 cm mixed attenuation lesion in the right adnexal space potentially representing poly cystic change  in the ovary versus a dilated tortuous right Fallopian tube. Pelvic ultrasound recommended to further evaluate. 2. Slight progression of retroperitoneal and right common iliac adenopathy. Stranding is identified in the retroperitoneal fat around the aorta and IVC extending into the extraperitoneal fat along the common iliac vessels bilaterally. Imaging features are nonspecific and could be reactive. Lymphoproliferative disorder not excluded. Close follow-up recommended. 3. Gallbladder wall is ill-defined with subtle pericholecystic edema. No calcified gallstones evident. Imaging features raise the question of acute cholecystitis. Right upper quadrant ultrasound recommended to further evaluate. 4. Stable 2.4 cm well-defined homogeneous cystic lesion arising from the uncinate process of the pancreas. Imaging features are nonspecific but could be related to a side branch IPMN. Follow-up outpatient nonemergent MRI abdomen with and without contrast recommended to further evaluate. 5.  Aortic Atherosclerosis (ICD10-I70.0). Electronically Signed   By: Donnal Fusi M.D.   On: 03/03/2024 11:22        Scheduled Meds:  brimonidine   1 drop Both Eyes TID   Difluprednate   1 drop Right Eye BID   insulin  aspart  0-6 Units Subcutaneous Q4H   latanoprost   1 drop Both Eyes QHS   pantoprazole   40 mg Oral Daily   sodium chloride  flush  10-40 mL Intracatheter Q12H   sodium chloride  flush  3 mL Intravenous Q12H   Continuous Infusions:  lactated ringers  75 mL/hr at 03/04/24 1610     LOS: 0 days   Time spent:  Haydee Lipa, DO [facility]  If 7PM-7AM, please contact night-coverage [email or web address]  03/05/2024, 8:09 AM

## 2024-03-05 NOTE — H&P (View-Only) (Signed)
 Assessment & Plan: Acute cholecystitis  Persistent RUQ abd pain  HIDA with non-vis of gallbladder  GYN consult incomplete - further workup needed  Plan to proceed with lap cholecystectomy today  Discussed proceeding with lap cholecystectomy today with patient and family at bedside.  Discussed procedure, potential complications, potential for open surgery, and anticipated post op recovery.  She understands and wishes to proceed today.  Will post case with OR.        [name], MD [facility] practice Office: [phone]        Chief Complaint: Abdominal pain  Subjective: Patient in bed, uncomfortable.  Family at bedside.  Objective: Vital signs in last 24 hours: Temp:  [98.4 F (36.9 C)-98.7 F (37.1 C)] 98.4 F (36.9 C) (05/28 0427) Pulse Rate:  [95-101] 98 (05/28 0427) Resp:  [12-18] 16 (05/28 0427) BP: (134-162)/(84-97) 134/94 (05/28 0427) SpO2:  [95 %-99 %] 95 % (05/28 0427) Last BM Date : 03/02/24  Intake/Output from previous day: 05/27 0701 - 05/28 0700 In: 1261.3 [P.O.:1080; I.V.:181.3] Out: 1150 [Urine:1150] Intake/Output this shift: No intake/output data recorded.  Physical Exam: HEENT - sclerae clear, mucous membranes moist Neck - soft Abdomen - soft, obese; tender RUQ, epigastrium; no mass Ext - no edema, non-tender  Lab Results:  Recent Labs    03/04/24 0422 03/05/24 0215  WBC 7.5 7.8  HGB 12.2 11.6*  HCT 36.3 34.8*  PLT 301 271   BMET Recent Labs    03/04/24 0422 03/05/24 0215  NA 134* 133*  K 3.4* 3.2*  CL 97* 97*  CO2 25 27  GLUCOSE 205* 177*  BUN 6 6  CREATININE 0.57 0.59  CALCIUM 9.1 8.5*   PT/INR No results for input(s): "LABPROT", "INR" in the last 72 hours. Comprehensive Metabolic Panel:    Component Value Date/Time   NA 133 (L) 03/05/2024 0215   NA 134 (L) 03/04/2024 0422   NA 136 02/22/2014 1557   K 3.2 (L) 03/05/2024 0215   K 3.4 (L) 03/04/2024 0422   K 3.0 (L) 02/22/2014 1557   CL  97 (L) 03/05/2024 0215   CL 97 (L) 03/04/2024 0422   CL 101 02/22/2014 1557   CO2 27 03/05/2024 0215   CO2 25 03/04/2024 0422   CO2 27 02/22/2014 1557   BUN 6 03/05/2024 0215   BUN 6 03/04/2024 0422   BUN 10 02/22/2014 1557   CREATININE 0.59 03/05/2024 0215   CREATININE 0.57 03/04/2024 0422   CREATININE 0.78 02/22/2014 1557   GLUCOSE 177 (H) 03/05/2024 0215   GLUCOSE 205 (H) 03/04/2024 0422   GLUCOSE 94 02/22/2014 1557   CALCIUM 8.5 (L) 03/05/2024 0215   CALCIUM 9.1 03/04/2024 0422   CALCIUM 10.0 02/22/2014 1557   AST 13 (L) 03/05/2024 0215   AST 15 03/04/2024 0422   AST 14 (L) 02/22/2014 1557   ALT 14 03/05/2024 0215   ALT 16 03/04/2024 0422   ALT 16 02/22/2014 1557   ALKPHOS 107 03/05/2024 0215   ALKPHOS 113 03/04/2024 0422   ALKPHOS 120 (H) 02/22/2014 1557   BILITOT 0.6 03/05/2024 0215   BILITOT 0.7 03/04/2024 0422   BILITOT 0.4 02/22/2014 1557   PROT 7.0 03/05/2024 0215   PROT 7.0 03/04/2024 0422   PROT 8.3 (H) 02/22/2014 1557   ALBUMIN 3.1 (L) 03/05/2024 0215   ALBUMIN 3.4 (L) 03/04/2024 0422   ALBUMIN 3.9 02/22/2014 1557    Studies/Results: NM Hepatobiliary Liver Func Result Date: 03/04/2024 CLINICAL DATA:  RIGHT  upper quadrant pain.  Cholelithiasis. EXAM: NUCLEAR MEDICINE HEPATOBILIARY IMAGING TECHNIQUE: Sequential images of the abdomen were obtained out to 60 minutes following intravenous administration of radiopharmaceutical. RADIOPHARMACEUTICALS:  5.3 mCi Tc-18m  Choletec IV COMPARISON:  Ultrasound CT 03/04/2019 FINDINGS: Prompt clearance radiotracer from blood pool and homogeneous uptake in liver. Counts are evident in the common bile duct and small bowel by 40 minutes. The gallbladder fails to fill at 45 minutes. 3 mg IV morphine  was administered to augment filling of the gallbladder. No filling of the gallbladder post morphine  augmentation. IMPRESSION: Non filling of the gallbladder most consistent with cystic duct obstruction / acute cholecystitis. These results  will be called to the ordering clinician or representative by the Radiologist Assistant, and communication documented in the PACS or [employer]. Electronically Signed   By: Deboraha Fallow M.D.   On: 03/04/2024 14:30   US  Pelvis Complete Result Date: 03/03/2024 CLINICAL DATA:  Right-sided abdominal and pelvic pain. Right adnexal cystic lesion on recent CT. EXAM: TRANSABDOMINAL AND TRANSVAGINAL ULTRASOUND OF PELVIS DOPPLER ULTRASOUND OF OVARIES TECHNIQUE: Both transabdominal and transvaginal ultrasound examinations of the pelvis were performed. Transabdominal technique was performed for global imaging of the pelvis including uterus, ovaries, adnexal regions, and pelvic cul-de-sac. It was necessary to proceed with endovaginal exam following the transabdominal exam to visualize the right adnexal lesion. Color and duplex Doppler ultrasound was utilized to evaluate blood flow to the ovaries. COMPARISON:  CT on 03/03/2024 FINDINGS: Uterus Measurements: 7.2 x 4.5 x 5.2 cm = volume: 89 mL. Diffusely heterogeneous myometrial echotexture is seen, without discrete myometrial masses. Endometrium Thickness: 7 mm. Right ovary Measurements: No normal ovary visualized. A complex cystic lesion is seen in the right adnexa measuring 6.6 x 4.1 x 4.8 cm. This lesion has multiple internal septations, some of which show mild thickening and internal blood flow on color and duplex Doppler. Differential diagnosis includes cystic ovarian neoplasm, hydro-pyo-salpinx, and endometrioma. Left ovary Measurements: 2.3 x 2.2 x 2.1 cm = volume: 5.5 mL. Normal appearance/no adnexal mass. Pulsed Doppler evaluation of both ovaries demonstrates normal low-resistance arterial and venous waveforms. Other findings No abnormal free fluid. IMPRESSION: 6.6 cm complex cystic lesion in the right adnexa, with multiple mildly thickened septations but no soft tissue component. Differential diagnosis includes cystic ovarian neoplasm, however, hydrosalpinx,  and endometrioma. No sonographic evidence for ovarian torsion. Electronically Signed   By: Marlyce Sine M.D.   On: 03/03/2024 13:38   US  Transvaginal Non-OB Result Date: 03/03/2024 CLINICAL DATA:  Right-sided abdominal and pelvic pain. Right adnexal cystic lesion on recent CT. EXAM: TRANSABDOMINAL AND TRANSVAGINAL ULTRASOUND OF PELVIS DOPPLER ULTRASOUND OF OVARIES TECHNIQUE: Both transabdominal and transvaginal ultrasound examinations of the pelvis were performed. Transabdominal technique was performed for global imaging of the pelvis including uterus, ovaries, adnexal regions, and pelvic cul-de-sac. It was necessary to proceed with endovaginal exam following the transabdominal exam to visualize the right adnexal lesion. Color and duplex Doppler ultrasound was utilized to evaluate blood flow to the ovaries. COMPARISON:  CT on 03/03/2024 FINDINGS: Uterus Measurements: 7.2 x 4.5 x 5.2 cm = volume: 89 mL. Diffusely heterogeneous myometrial echotexture is seen, without discrete myometrial masses. Endometrium Thickness: 7 mm. Right ovary Measurements: No normal ovary visualized. A complex cystic lesion is seen in the right adnexa measuring 6.6 x 4.1 x 4.8 cm. This lesion has multiple internal septations, some of which show mild thickening and internal blood flow on color and duplex Doppler. Differential diagnosis includes cystic ovarian neoplasm, hydro-pyo-salpinx, and endometrioma. Left  ovary Measurements: 2.3 x 2.2 x 2.1 cm = volume: 5.5 mL. Normal appearance/no adnexal mass. Pulsed Doppler evaluation of both ovaries demonstrates normal low-resistance arterial and venous waveforms. Other findings No abnormal free fluid. IMPRESSION: 6.6 cm complex cystic lesion in the right adnexa, with multiple mildly thickened septations but no soft tissue component. Differential diagnosis includes cystic ovarian neoplasm, however, hydrosalpinx, and endometrioma. No sonographic evidence for ovarian torsion. Electronically Signed    By: Marlyce Sine M.D.   On: 03/03/2024 13:38   US  Art/Ven Flow Abd Pelv Doppler Result Date: 03/03/2024 CLINICAL DATA:  Right-sided abdominal and pelvic pain. Right adnexal cystic lesion on recent CT. EXAM: TRANSABDOMINAL AND TRANSVAGINAL ULTRASOUND OF PELVIS DOPPLER ULTRASOUND OF OVARIES TECHNIQUE: Both transabdominal and transvaginal ultrasound examinations of the pelvis were performed. Transabdominal technique was performed for global imaging of the pelvis including uterus, ovaries, adnexal regions, and pelvic cul-de-sac. It was necessary to proceed with endovaginal exam following the transabdominal exam to visualize the right adnexal lesion. Color and duplex Doppler ultrasound was utilized to evaluate blood flow to the ovaries. COMPARISON:  CT on 03/03/2024 FINDINGS: Uterus Measurements: 7.2 x 4.5 x 5.2 cm = volume: 89 mL. Diffusely heterogeneous myometrial echotexture is seen, without discrete myometrial masses. Endometrium Thickness: 7 mm. Right ovary Measurements: No normal ovary visualized. A complex cystic lesion is seen in the right adnexa measuring 6.6 x 4.1 x 4.8 cm. This lesion has multiple internal septations, some of which show mild thickening and internal blood flow on color and duplex Doppler. Differential diagnosis includes cystic ovarian neoplasm, hydro-pyo-salpinx, and endometrioma. Left ovary Measurements: 2.3 x 2.2 x 2.1 cm = volume: 5.5 mL. Normal appearance/no adnexal mass. Pulsed Doppler evaluation of both ovaries demonstrates normal low-resistance arterial and venous waveforms. Other findings No abnormal free fluid. IMPRESSION: 6.6 cm complex cystic lesion in the right adnexa, with multiple mildly thickened septations but no soft tissue component. Differential diagnosis includes cystic ovarian neoplasm, however, hydrosalpinx, and endometrioma. No sonographic evidence for ovarian torsion. Electronically Signed   By: Marlyce Sine M.D.   On: 03/03/2024 13:38   US  Abdomen Limited RUQ  (LIVER/GB) Result Date: 03/03/2024 CLINICAL DATA:  Right upper quadrant pain for a day EXAM: ULTRASOUND ABDOMEN LIMITED RIGHT UPPER QUADRANT COMPARISON:  CT 03/02/2024. FINDINGS: Gallbladder: Distended gallbladder with layering stones. No wall thickening or adjacent fluid. No reported sonographic Murphy's sign. Common bile duct: Diameter: 3 mm Liver: Diffusely echogenic hepatic parenchyma consistent with fatty liver infiltration. Portal vein is patent on color Doppler imaging with normal direction of blood flow towards the liver. Other: Evaluation limited by overlapping bowel gas and soft tissue. IMPRESSION: Distended gallbladder with stones. No further sonographic evidence of acute cholecystitis. No ductal dilatation. Fatty liver infiltration. Electronically Signed   By: Adrianna Horde M.D.   On: 03/03/2024 13:22   CT ABDOMEN PELVIS W CONTRAST Result Date: 03/03/2024 CLINICAL DATA:  Abdominal pain. EXAM: CT ABDOMEN AND PELVIS WITH CONTRAST TECHNIQUE: Multidetector CT imaging of the abdomen and pelvis was performed using the standard protocol following bolus administration of intravenous contrast. RADIATION DOSE REDUCTION: This exam was performed according to the departmental dose-optimization program which includes automated exposure control, adjustment of the mA and/or kV according to patient size and/or use of iterative reconstruction technique. CONTRAST:  OMNIPAQUE  IOHEXOL  300 MG/ML  SOLN COMPARISON:  08/12/2023 FINDINGS: Lower chest: Dependent atelectasis. Hepatobiliary: No suspicious focal abnormality within the liver parenchyma. Similar appearance of subcapsular low-density in the medial segment left liver, likely due to focal  fatty deposition. Areas of fatty sparing seen along the subcapsular liver adjacent to the gallbladder fossa. Gallbladder wall is ill-defined with subtle pericholecystic edema. No calcified gallstones evident. No intrahepatic or extrahepatic biliary dilation. Pancreas: 2.4 cm  well-defined homogeneous cystic lesion arising from the uncinate process of the pancreas is stable. No main pancreatic duct dilatation. Spleen: No splenomegaly. No suspicious focal mass lesion. Adrenals/Urinary Tract: No adrenal nodule or mass. Kidneys unremarkable. No evidence for hydroureter. The urinary bladder appears normal for the degree of distention. Stomach/Bowel: Stomach is unremarkable. No gastric wall thickening. No evidence of outlet obstruction. Duodenum is normally positioned as is the ligament of Treitz. No small bowel wall thickening. No small bowel dilatation. The terminal ileum is normal. The appendix is normal. No gross colonic mass. No colonic wall thickening. Mild left colonic diverticulosis without diverticulitis. Vascular/Lymphatic: There is mild atherosclerotic calcification of the abdominal aorta without aneurysm. Slight progression of gastrohepatic and hepatoduodenal ligament lymph nodes with 12 mm short axis HDL lymph node seen on 26/3 retrocrural low-density nodules again noted. The precaval node measured previously at 12 mm short axis is 14 mm today on 51/3. 15 mm short axis right common iliac node measured previously is 16 mm today on 63/3. Stranding is identified in the retroperitoneal fat around the aorta and IVC extending into the extraperitoneal fat along the common iliac vessels bilaterally. Reproductive: The uterus is unremarkable. No left adnexal mass. Interval development of a 5.2 x 4.6 x 6.3 cm mixed attenuation lesion in the right adnexal space potentially representing poly cystic change in the ovary versus a dilated tortuous right fallopian tube. Tubal ligation clips are noted bilaterally. Other: No intraperitoneal free fluid. Musculoskeletal: No worrisome lytic or sclerotic osseous abnormality. IMPRESSION: 1. Interval development of a 5.2 x 4.6 x 6.3 cm mixed attenuation lesion in the right adnexal space potentially representing poly cystic change in the ovary versus a  dilated tortuous right Fallopian tube. Pelvic ultrasound recommended to further evaluate. 2. Slight progression of retroperitoneal and right common iliac adenopathy. Stranding is identified in the retroperitoneal fat around the aorta and IVC extending into the extraperitoneal fat along the common iliac vessels bilaterally. Imaging features are nonspecific and could be reactive. Lymphoproliferative disorder not excluded. Close follow-up recommended. 3. Gallbladder wall is ill-defined with subtle pericholecystic edema. No calcified gallstones evident. Imaging features raise the question of acute cholecystitis. Right upper quadrant ultrasound recommended to further evaluate. 4. Stable 2.4 cm well-defined homogeneous cystic lesion arising from the uncinate process of the pancreas. Imaging features are nonspecific but could be related to a side branch IPMN. Follow-up outpatient nonemergent MRI abdomen with and without contrast recommended to further evaluate. 5.  Aortic Atherosclerosis (ICD10-I70.0). Electronically Signed   By: Donnal Fusi M.D.   On: 03/03/2024 11:22      [name] 03/05/2024  Patient ID: Lauren Bradford, female   DOB: Aug 21, 1976, 48 y.o.   MRN: [id number]

## 2024-03-05 NOTE — Anesthesia Postprocedure Evaluation (Signed)
 Anesthesia Post Note  Patient: Lauren Bradford  Procedure(s) Performed: LAPAROSCOPIC CHOLECYSTECTOMY     Patient location during evaluation: PACU Anesthesia Type: General Level of consciousness: awake Pain management: pain level controlled Vital Signs Assessment: post-procedure vital signs reviewed and stable Respiratory status: spontaneous breathing, nonlabored ventilation and respiratory function stable Cardiovascular status: blood pressure returned to baseline and stable Postop Assessment: no apparent nausea or vomiting Anesthetic complications: no   No notable events documented.  Last Vitals:  Vitals:   03/05/24 1445 03/05/24 1500  BP: 119/74 113/77  Pulse: 87 85  Resp: 10 17  Temp:    SpO2: 90% 97%    Last Pain:  Vitals:   03/05/24 1500  TempSrc:   PainSc: 0-No pain                 Conard Decent

## 2024-03-05 NOTE — Interval H&P Note (Signed)
 History and Physical Interval Note:  03/05/2024 12:10 PM  Lauren Bradford  has presented today for surgery, with the diagnosis of ACUTE CHOLECYCSITIS.  The various methods of treatment have been discussed with the patient and family. After consideration of risks, benefits and other options for treatment, the patient has consented to    Procedure(s): LAPAROSCOPIC CHOLECYSTECTOMY WITH INTRAOPERATIVE CHOLANGIOGRAM (N/A) as a surgical intervention.    The patient's history has been reviewed, patient examined, no change in status, stable for surgery.  I have reviewed the patient's chart and labs.  Questions were answered to the patient's satisfaction.    [name], MD [facility] practice Office: [phone]   [name]

## 2024-03-05 NOTE — Anesthesia Procedure Notes (Signed)
 Procedure Name: Intubation Date/Time: 03/05/2024 12:46 PM  Performed by: Darlena Ego, CRNAPre-anesthesia Checklist: Patient identified, Emergency Drugs available, Suction available and Patient being monitored Patient Re-evaluated:Patient Re-evaluated prior to induction Oxygen Delivery Method: Circle System Utilized Preoxygenation: Pre-oxygenation with 100% oxygen Induction Type: IV induction Ventilation: Mask ventilation without difficulty Grade View: Grade II Tube type: Oral Number of attempts: 1 Airway Equipment and Method: Stylet and Oral airway Placement Confirmation: ETT inserted through vocal cords under direct vision, positive ETCO2 and breath sounds checked- equal and bilateral Secured at: 21 cm Tube secured with: Tape Dental Injury: Teeth and Oropharynx as per pre-operative assessment

## 2024-03-05 NOTE — Anesthesia Preprocedure Evaluation (Addendum)
 Anesthesia Evaluation  Patient identified by MRN, date of birth, ID band Patient awake    Reviewed: Allergy & Precautions, NPO status , Patient's Chart, lab work & pertinent test results  History of Anesthesia Complications Negative for: history of anesthetic complications  Airway Mallampati: III  TM Distance: >3 FB Neck ROM: Full    Dental  (+) Dental Advisory Given, Upper Dentures, Lower Dentures   Pulmonary neg shortness of breath, asthma , neg sleep apnea, neg COPD, neg recent URI, former smoker   Pulmonary exam normal breath sounds clear to auscultation       Cardiovascular hypertension (olmesartan-HCTZ), Pt. on medications (-) angina (-) Past MI, (-) Cardiac Stents and (-) CABG (-) dysrhythmias  Rhythm:Regular Rate:Normal     Neuro/Psych negative neurological ROS     GI/Hepatic Neg liver ROS,neg GERD  ,,Acute cholecystitis, pancreatic cystic lesion   Endo/Other  diabetes, Type 2    Renal/GU negative Renal ROS     Musculoskeletal   Abdominal  (+) + obese  Peds  Hematology negative hematology ROS (+) Lab Results      Component                Value               Date                      WBC                      7.8                 03/05/2024                HGB                      11.6 (L)            03/05/2024                HCT                      34.8 (L)            03/05/2024                MCV                      88.1                03/05/2024                PLT                      271                 03/05/2024              Anesthesia Other Findings   Reproductive/Obstetrics                             Anesthesia Physical Anesthesia Plan  ASA: 2  Anesthesia Plan: General   Post-op Pain Management:    Induction: Intravenous  PONV Risk Score and Plan: 3 and Ondansetron , Dexamethasone  and Treatment may vary due to age or medical condition  Airway Management Planned:  Oral ETT  Additional Equipment:   Intra-op Plan:   Post-operative Plan: Extubation  in OR  Informed Consent: I have reviewed the patients History and Physical, chart, labs and discussed the procedure including the risks, benefits and alternatives for the proposed anesthesia with the patient or authorized representative who has indicated his/her understanding and acceptance.     Dental advisory given  Plan Discussed with: CRNA and Anesthesiologist  Anesthesia Plan Comments: (Risks of general anesthesia discussed including, but not limited to, sore throat, hoarse voice, chipped/damaged teeth, injury to vocal cords, nausea and vomiting, allergic reactions, lung infection, heart attack, stroke, and death. All questions answered. )        Anesthesia Quick Evaluation

## 2024-03-05 NOTE — Transfer of Care (Signed)
 Immediate Anesthesia Transfer of Care Note  Patient: Lauren Bradford  Procedure(s) Performed: LAPAROSCOPIC CHOLECYSTECTOMY  Patient Location: PACU  Anesthesia Type:General  Level of Consciousness: sedated and responds to stimulation  Airway & Oxygen Therapy: Patient Spontanous Breathing and Patient connected to face mask oxygen  Post-op Assessment: Report given to RN  Post vital signs: Reviewed and stable  Last Vitals:  Vitals Value Taken Time  BP 112/69 03/05/24 1421  Temp    Pulse 87 03/05/24 1423  Resp 15 03/05/24 1423  SpO2 97 % 03/05/24 1423  Vitals shown include unfiled device data.  Last Pain:  Vitals:   03/05/24 1106  TempSrc:   PainSc: 4       Patients Stated Pain Goal: 0 (03/03/24 1813)  Complications: No notable events documented.

## 2024-03-05 NOTE — Op Note (Signed)
 Procedure Note  Pre-operative Diagnosis:  acute cholecystitis  Post-operative Diagnosis:  acute cholecystitis, cholelithiasis  Surgeon:  [name], MD  Assistant:  none   Procedure:  Laparoscopic cholecystectomy  Anesthesia:  General  Estimated Blood Loss:  25 cc  Drains: none         Specimen: gallbladder to pathology  Indications: Patient is a 48 year old female admitted with abdominal pain.  CT scan indicated acute cholecystitis.  This was confirmed by a nuclear medicine hepatobiliary scan which had nonvisualization of the gallbladder.  Patient now comes to surgery for cholecystectomy.  Procedure description: The patient was seen in the pre-op holding area. The risks, benefits, complications, treatment options, and expected outcomes were previously discussed with the patient. The patient agreed with the proposed plan and has signed the informed consent form.  The patient was transported to operating room #4 at the [facility]. The patient was placed in the supine position on the operating room table. Following induction of general anesthesia, the abdomen was prepped and draped in the usual aseptic fashion.  An incision was made in the skin near the umbilicus. The midline fascia was incised and the peritoneal cavity was entered and a Hasson cannula was introduced under direct vision. The cannula was secured with a 0-Vicryl pursestring suture. Pneumoperitoneum was established with carbon dioxide. Additional cannulae were introduced under direct vision along the right costal margin in the midline, mid-clavicular line, and anterior axillary line.   The gallbladder was identified with some difficulty as there were numerous adhesions of the omentum to the undersurface of the gallbladder.  With gentle blunt dissection the dome of the gallbladder was identified.  It was aspirated with the trocar and contained dark bile.  Gallbladder was then grasped and retracted cephalad.   Adhesions were taken down bluntly and the electrocautery was utilized as needed, taking care not to involve any adjacent structures. The infundibulum was grasped and retracted laterally, exposing the peritoneum overlying the triangle of Calot. The peritoneum was incised and structures exposed with blunt dissection. The cystic duct was clearly identified, bluntly dissected circumferentially, and clipped at the neck of the gallbladder. The cystic duct was then ligated with ligaclips and divided. The cystic artery was identified, dissected circumferentially, ligated with ligaclips, and divided.  The gallbladder was dissected away from the gallbladder bed using the electrocautery for hemostasis. The gallbladder was completely removed from the liver and placed into an endocatch bag. The gallbladder was removed in the endocatch bag through the umbilical port site and submitted to pathology for review.  On palpation, the gallbladder contained large concretions of choleliths.  The right upper quadrant was irrigated and the gallbladder bed was inspected. Hemostasis was achieved with the electrocautery.  A sheet of "snow" was placed in the gallbladder bed.  Cannulae were removed under direct vision and good hemostasis was noted. Pneumoperitoneum was released and the majority of the carbon dioxide evacuated. The umbilical wound was irrigated and the fascia was then closed with the pursestring suture.  Local anesthetic was infiltrated at all port sites. Skin incisions were closed with 4-0 Monocril subcuticular sutures and Dermabond was applied.  Instrument, sponge, and needle counts were correct at the conclusion of the case.  The patient was awakened from anesthesia and brought to the recovery room in stable condition.  The patient tolerated the procedure well.   [name], MD [facility] Surgery Office: [phone]

## 2024-03-05 NOTE — Discharge Instructions (Addendum)

## 2024-03-05 NOTE — Progress Notes (Signed)
 Assessment & Plan: Acute cholecystitis  Persistent RUQ abd pain  HIDA with non-vis of gallbladder  GYN consult incomplete - further workup needed  Plan to proceed with lap cholecystectomy today  Discussed proceeding with lap cholecystectomy today with patient and family at bedside.  Discussed procedure, potential complications, potential for open surgery, and anticipated post op recovery.  She understands and wishes to proceed today.  Will post case with OR.        [name], MD [facility] practice Office: [phone]        Chief Complaint: Abdominal pain  Subjective: Patient in bed, uncomfortable.  Family at bedside.  Objective: Vital signs in last 24 hours: Temp:  [98.4 F (36.9 C)-98.7 F (37.1 C)] 98.4 F (36.9 C) (05/28 0427) Pulse Rate:  [95-101] 98 (05/28 0427) Resp:  [12-18] 16 (05/28 0427) BP: (134-162)/(84-97) 134/94 (05/28 0427) SpO2:  [95 %-99 %] 95 % (05/28 0427) Last BM Date : 03/02/24  Intake/Output from previous day: 05/27 0701 - 05/28 0700 In: 1261.3 [P.O.:1080; I.V.:181.3] Out: 1150 [Urine:1150] Intake/Output this shift: No intake/output data recorded.  Physical Exam: HEENT - sclerae clear, mucous membranes moist Neck - soft Abdomen - soft, obese; tender RUQ, epigastrium; no mass Ext - no edema, non-tender  Lab Results:  Recent Labs    03/04/24 0422 03/05/24 0215  WBC 7.5 7.8  HGB 12.2 11.6*  HCT 36.3 34.8*  PLT 301 271   BMET Recent Labs    03/04/24 0422 03/05/24 0215  NA 134* 133*  K 3.4* 3.2*  CL 97* 97*  CO2 25 27  GLUCOSE 205* 177*  BUN 6 6  CREATININE 0.57 0.59  CALCIUM 9.1 8.5*   PT/INR No results for input(s): "LABPROT", "INR" in the last 72 hours. Comprehensive Metabolic Panel:    Component Value Date/Time   NA 133 (L) 03/05/2024 0215   NA 134 (L) 03/04/2024 0422   NA 136 02/22/2014 1557   K 3.2 (L) 03/05/2024 0215   K 3.4 (L) 03/04/2024 0422   K 3.0 (L) 02/22/2014 1557   CL  97 (L) 03/05/2024 0215   CL 97 (L) 03/04/2024 0422   CL 101 02/22/2014 1557   CO2 27 03/05/2024 0215   CO2 25 03/04/2024 0422   CO2 27 02/22/2014 1557   BUN 6 03/05/2024 0215   BUN 6 03/04/2024 0422   BUN 10 02/22/2014 1557   CREATININE 0.59 03/05/2024 0215   CREATININE 0.57 03/04/2024 0422   CREATININE 0.78 02/22/2014 1557   GLUCOSE 177 (H) 03/05/2024 0215   GLUCOSE 205 (H) 03/04/2024 0422   GLUCOSE 94 02/22/2014 1557   CALCIUM 8.5 (L) 03/05/2024 0215   CALCIUM 9.1 03/04/2024 0422   CALCIUM 10.0 02/22/2014 1557   AST 13 (L) 03/05/2024 0215   AST 15 03/04/2024 0422   AST 14 (L) 02/22/2014 1557   ALT 14 03/05/2024 0215   ALT 16 03/04/2024 0422   ALT 16 02/22/2014 1557   ALKPHOS 107 03/05/2024 0215   ALKPHOS 113 03/04/2024 0422   ALKPHOS 120 (H) 02/22/2014 1557   BILITOT 0.6 03/05/2024 0215   BILITOT 0.7 03/04/2024 0422   BILITOT 0.4 02/22/2014 1557   PROT 7.0 03/05/2024 0215   PROT 7.0 03/04/2024 0422   PROT 8.3 (H) 02/22/2014 1557   ALBUMIN 3.1 (L) 03/05/2024 0215   ALBUMIN 3.4 (L) 03/04/2024 0422   ALBUMIN 3.9 02/22/2014 1557    Studies/Results: NM Hepatobiliary Liver Func Result Date: 03/04/2024 CLINICAL DATA:  RIGHT  upper quadrant pain.  Cholelithiasis. EXAM: NUCLEAR MEDICINE HEPATOBILIARY IMAGING TECHNIQUE: Sequential images of the abdomen were obtained out to 60 minutes following intravenous administration of radiopharmaceutical. RADIOPHARMACEUTICALS:  5.3 mCi Tc-18m  Choletec IV COMPARISON:  Ultrasound CT 03/04/2019 FINDINGS: Prompt clearance radiotracer from blood pool and homogeneous uptake in liver. Counts are evident in the common bile duct and small bowel by 40 minutes. The gallbladder fails to fill at 45 minutes. 3 mg IV morphine  was administered to augment filling of the gallbladder. No filling of the gallbladder post morphine  augmentation. IMPRESSION: Non filling of the gallbladder most consistent with cystic duct obstruction / acute cholecystitis. These results  will be called to the ordering clinician or representative by the Radiologist Assistant, and communication documented in the PACS or [employer]. Electronically Signed   By: Deboraha Fallow M.D.   On: 03/04/2024 14:30   US  Pelvis Complete Result Date: 03/03/2024 CLINICAL DATA:  Right-sided abdominal and pelvic pain. Right adnexal cystic lesion on recent CT. EXAM: TRANSABDOMINAL AND TRANSVAGINAL ULTRASOUND OF PELVIS DOPPLER ULTRASOUND OF OVARIES TECHNIQUE: Both transabdominal and transvaginal ultrasound examinations of the pelvis were performed. Transabdominal technique was performed for global imaging of the pelvis including uterus, ovaries, adnexal regions, and pelvic cul-de-sac. It was necessary to proceed with endovaginal exam following the transabdominal exam to visualize the right adnexal lesion. Color and duplex Doppler ultrasound was utilized to evaluate blood flow to the ovaries. COMPARISON:  CT on 03/03/2024 FINDINGS: Uterus Measurements: 7.2 x 4.5 x 5.2 cm = volume: 89 mL. Diffusely heterogeneous myometrial echotexture is seen, without discrete myometrial masses. Endometrium Thickness: 7 mm. Right ovary Measurements: No normal ovary visualized. A complex cystic lesion is seen in the right adnexa measuring 6.6 x 4.1 x 4.8 cm. This lesion has multiple internal septations, some of which show mild thickening and internal blood flow on color and duplex Doppler. Differential diagnosis includes cystic ovarian neoplasm, hydro-pyo-salpinx, and endometrioma. Left ovary Measurements: 2.3 x 2.2 x 2.1 cm = volume: 5.5 mL. Normal appearance/no adnexal mass. Pulsed Doppler evaluation of both ovaries demonstrates normal low-resistance arterial and venous waveforms. Other findings No abnormal free fluid. IMPRESSION: 6.6 cm complex cystic lesion in the right adnexa, with multiple mildly thickened septations but no soft tissue component. Differential diagnosis includes cystic ovarian neoplasm, however, hydrosalpinx,  and endometrioma. No sonographic evidence for ovarian torsion. Electronically Signed   By: Marlyce Sine M.D.   On: 03/03/2024 13:38   US  Transvaginal Non-OB Result Date: 03/03/2024 CLINICAL DATA:  Right-sided abdominal and pelvic pain. Right adnexal cystic lesion on recent CT. EXAM: TRANSABDOMINAL AND TRANSVAGINAL ULTRASOUND OF PELVIS DOPPLER ULTRASOUND OF OVARIES TECHNIQUE: Both transabdominal and transvaginal ultrasound examinations of the pelvis were performed. Transabdominal technique was performed for global imaging of the pelvis including uterus, ovaries, adnexal regions, and pelvic cul-de-sac. It was necessary to proceed with endovaginal exam following the transabdominal exam to visualize the right adnexal lesion. Color and duplex Doppler ultrasound was utilized to evaluate blood flow to the ovaries. COMPARISON:  CT on 03/03/2024 FINDINGS: Uterus Measurements: 7.2 x 4.5 x 5.2 cm = volume: 89 mL. Diffusely heterogeneous myometrial echotexture is seen, without discrete myometrial masses. Endometrium Thickness: 7 mm. Right ovary Measurements: No normal ovary visualized. A complex cystic lesion is seen in the right adnexa measuring 6.6 x 4.1 x 4.8 cm. This lesion has multiple internal septations, some of which show mild thickening and internal blood flow on color and duplex Doppler. Differential diagnosis includes cystic ovarian neoplasm, hydro-pyo-salpinx, and endometrioma. Left  ovary Measurements: 2.3 x 2.2 x 2.1 cm = volume: 5.5 mL. Normal appearance/no adnexal mass. Pulsed Doppler evaluation of both ovaries demonstrates normal low-resistance arterial and venous waveforms. Other findings No abnormal free fluid. IMPRESSION: 6.6 cm complex cystic lesion in the right adnexa, with multiple mildly thickened septations but no soft tissue component. Differential diagnosis includes cystic ovarian neoplasm, however, hydrosalpinx, and endometrioma. No sonographic evidence for ovarian torsion. Electronically Signed    By: Marlyce Sine M.D.   On: 03/03/2024 13:38   US  Art/Ven Flow Abd Pelv Doppler Result Date: 03/03/2024 CLINICAL DATA:  Right-sided abdominal and pelvic pain. Right adnexal cystic lesion on recent CT. EXAM: TRANSABDOMINAL AND TRANSVAGINAL ULTRASOUND OF PELVIS DOPPLER ULTRASOUND OF OVARIES TECHNIQUE: Both transabdominal and transvaginal ultrasound examinations of the pelvis were performed. Transabdominal technique was performed for global imaging of the pelvis including uterus, ovaries, adnexal regions, and pelvic cul-de-sac. It was necessary to proceed with endovaginal exam following the transabdominal exam to visualize the right adnexal lesion. Color and duplex Doppler ultrasound was utilized to evaluate blood flow to the ovaries. COMPARISON:  CT on 03/03/2024 FINDINGS: Uterus Measurements: 7.2 x 4.5 x 5.2 cm = volume: 89 mL. Diffusely heterogeneous myometrial echotexture is seen, without discrete myometrial masses. Endometrium Thickness: 7 mm. Right ovary Measurements: No normal ovary visualized. A complex cystic lesion is seen in the right adnexa measuring 6.6 x 4.1 x 4.8 cm. This lesion has multiple internal septations, some of which show mild thickening and internal blood flow on color and duplex Doppler. Differential diagnosis includes cystic ovarian neoplasm, hydro-pyo-salpinx, and endometrioma. Left ovary Measurements: 2.3 x 2.2 x 2.1 cm = volume: 5.5 mL. Normal appearance/no adnexal mass. Pulsed Doppler evaluation of both ovaries demonstrates normal low-resistance arterial and venous waveforms. Other findings No abnormal free fluid. IMPRESSION: 6.6 cm complex cystic lesion in the right adnexa, with multiple mildly thickened septations but no soft tissue component. Differential diagnosis includes cystic ovarian neoplasm, however, hydrosalpinx, and endometrioma. No sonographic evidence for ovarian torsion. Electronically Signed   By: Marlyce Sine M.D.   On: 03/03/2024 13:38   US  Abdomen Limited RUQ  (LIVER/GB) Result Date: 03/03/2024 CLINICAL DATA:  Right upper quadrant pain for a day EXAM: ULTRASOUND ABDOMEN LIMITED RIGHT UPPER QUADRANT COMPARISON:  CT 03/02/2024. FINDINGS: Gallbladder: Distended gallbladder with layering stones. No wall thickening or adjacent fluid. No reported sonographic Murphy's sign. Common bile duct: Diameter: 3 mm Liver: Diffusely echogenic hepatic parenchyma consistent with fatty liver infiltration. Portal vein is patent on color Doppler imaging with normal direction of blood flow towards the liver. Other: Evaluation limited by overlapping bowel gas and soft tissue. IMPRESSION: Distended gallbladder with stones. No further sonographic evidence of acute cholecystitis. No ductal dilatation. Fatty liver infiltration. Electronically Signed   By: Adrianna Horde M.D.   On: 03/03/2024 13:22   CT ABDOMEN PELVIS W CONTRAST Result Date: 03/03/2024 CLINICAL DATA:  Abdominal pain. EXAM: CT ABDOMEN AND PELVIS WITH CONTRAST TECHNIQUE: Multidetector CT imaging of the abdomen and pelvis was performed using the standard protocol following bolus administration of intravenous contrast. RADIATION DOSE REDUCTION: This exam was performed according to the departmental dose-optimization program which includes automated exposure control, adjustment of the mA and/or kV according to patient size and/or use of iterative reconstruction technique. CONTRAST:  OMNIPAQUE  IOHEXOL  300 MG/ML  SOLN COMPARISON:  08/12/2023 FINDINGS: Lower chest: Dependent atelectasis. Hepatobiliary: No suspicious focal abnormality within the liver parenchyma. Similar appearance of subcapsular low-density in the medial segment left liver, likely due to focal  fatty deposition. Areas of fatty sparing seen along the subcapsular liver adjacent to the gallbladder fossa. Gallbladder wall is ill-defined with subtle pericholecystic edema. No calcified gallstones evident. No intrahepatic or extrahepatic biliary dilation. Pancreas: 2.4 cm  well-defined homogeneous cystic lesion arising from the uncinate process of the pancreas is stable. No main pancreatic duct dilatation. Spleen: No splenomegaly. No suspicious focal mass lesion. Adrenals/Urinary Tract: No adrenal nodule or mass. Kidneys unremarkable. No evidence for hydroureter. The urinary bladder appears normal for the degree of distention. Stomach/Bowel: Stomach is unremarkable. No gastric wall thickening. No evidence of outlet obstruction. Duodenum is normally positioned as is the ligament of Treitz. No small bowel wall thickening. No small bowel dilatation. The terminal ileum is normal. The appendix is normal. No gross colonic mass. No colonic wall thickening. Mild left colonic diverticulosis without diverticulitis. Vascular/Lymphatic: There is mild atherosclerotic calcification of the abdominal aorta without aneurysm. Slight progression of gastrohepatic and hepatoduodenal ligament lymph nodes with 12 mm short axis HDL lymph node seen on 26/3 retrocrural low-density nodules again noted. The precaval node measured previously at 12 mm short axis is 14 mm today on 51/3. 15 mm short axis right common iliac node measured previously is 16 mm today on 63/3. Stranding is identified in the retroperitoneal fat around the aorta and IVC extending into the extraperitoneal fat along the common iliac vessels bilaterally. Reproductive: The uterus is unremarkable. No left adnexal mass. Interval development of a 5.2 x 4.6 x 6.3 cm mixed attenuation lesion in the right adnexal space potentially representing poly cystic change in the ovary versus a dilated tortuous right fallopian tube. Tubal ligation clips are noted bilaterally. Other: No intraperitoneal free fluid. Musculoskeletal: No worrisome lytic or sclerotic osseous abnormality. IMPRESSION: 1. Interval development of a 5.2 x 4.6 x 6.3 cm mixed attenuation lesion in the right adnexal space potentially representing poly cystic change in the ovary versus a  dilated tortuous right Fallopian tube. Pelvic ultrasound recommended to further evaluate. 2. Slight progression of retroperitoneal and right common iliac adenopathy. Stranding is identified in the retroperitoneal fat around the aorta and IVC extending into the extraperitoneal fat along the common iliac vessels bilaterally. Imaging features are nonspecific and could be reactive. Lymphoproliferative disorder not excluded. Close follow-up recommended. 3. Gallbladder wall is ill-defined with subtle pericholecystic edema. No calcified gallstones evident. Imaging features raise the question of acute cholecystitis. Right upper quadrant ultrasound recommended to further evaluate. 4. Stable 2.4 cm well-defined homogeneous cystic lesion arising from the uncinate process of the pancreas. Imaging features are nonspecific but could be related to a side branch IPMN. Follow-up outpatient nonemergent MRI abdomen with and without contrast recommended to further evaluate. 5.  Aortic Atherosclerosis (ICD10-I70.0). Electronically Signed   By: Donnal Fusi M.D.   On: 03/03/2024 11:22      [name] 03/05/2024  Patient ID: Lauren Bradford, female   DOB: Aug 21, 1976, 48 y.o.   MRN: [id number]

## 2024-03-06 ENCOUNTER — Encounter (HOSPITAL_COMMUNITY): Payer: Self-pay | Admitting: Surgery

## 2024-03-06 DIAGNOSIS — K810 Acute cholecystitis: Secondary | ICD-10-CM | POA: Diagnosis not present

## 2024-03-06 DIAGNOSIS — K8001 Calculus of gallbladder with acute cholecystitis with obstruction: Secondary | ICD-10-CM | POA: Diagnosis not present

## 2024-03-06 LAB — COMPREHENSIVE METABOLIC PANEL WITH GFR
ALT: 25 U/L (ref 0–44)
AST: 27 U/L (ref 15–41)
Albumin: 3.1 g/dL — ABNORMAL LOW (ref 3.5–5.0)
Alkaline Phosphatase: 98 U/L (ref 38–126)
Anion gap: 9 (ref 5–15)
BUN: 8 mg/dL (ref 6–20)
CO2: 26 mmol/L (ref 22–32)
Calcium: 8.2 mg/dL — ABNORMAL LOW (ref 8.9–10.3)
Chloride: 97 mmol/L — ABNORMAL LOW (ref 98–111)
Creatinine, Ser: 0.72 mg/dL (ref 0.44–1.00)
GFR, Estimated: >60 mL/min (ref >60)
Glucose, Bld: 139 mg/dL — ABNORMAL HIGH (ref 70–99)
Potassium: 3.3 mmol/L — ABNORMAL LOW (ref 3.5–5.1)
Sodium: 132 mmol/L — ABNORMAL LOW (ref 135–145)
Total Bilirubin: 0.7 mg/dL (ref 0.0–1.2)
Total Protein: 7.1 g/dL (ref 6.5–8.1)

## 2024-03-06 LAB — CBC
HCT: 32.6 % — ABNORMAL LOW (ref 36.0–46.0)
Hemoglobin: 10.7 g/dL — ABNORMAL LOW (ref 12.0–15.0)
MCH: 29.2 pg (ref 26.0–34.0)
MCHC: 32.8 g/dL (ref 30.0–36.0)
MCV: 88.8 fL (ref 80.0–100.0)
Platelets: 266 10*3/uL (ref 150–400)
RBC: 3.67 MIL/uL — ABNORMAL LOW (ref 3.87–5.11)
RDW: 13.2 % (ref 11.5–15.5)
WBC: 7 10*3/uL (ref 4.0–10.5)
nRBC: 0 % (ref 0.0–0.2)

## 2024-03-06 LAB — GLUCOSE, CAPILLARY
Glucose-Capillary: 143 mg/dL — ABNORMAL HIGH (ref 70–99)
Glucose-Capillary: 142 mg/dL — ABNORMAL HIGH (ref 70–99)
Glucose-Capillary: 154 mg/dL — ABNORMAL HIGH (ref 70–99)
Glucose-Capillary: 205 mg/dL — ABNORMAL HIGH (ref 70–99)
Glucose-Capillary: 186 mg/dL — ABNORMAL HIGH (ref 70–99)
Glucose-Capillary: 242 mg/dL — ABNORMAL HIGH (ref 70–99)

## 2024-03-06 MED ORDER — SENNOSIDES-DOCUSATE SODIUM 8.6-50 MG PO TABS
1.0000 | ORAL_TABLET | Freq: Two times a day (BID) | ORAL | Status: DC
  Administered 2024-03-06 – 2024-03-07 (×3): 1 via ORAL
  Filled 2024-03-06 (×3): qty 1

## 2024-03-06 MED ORDER — POLYETHYLENE GLYCOL 3350 17 G PO PACK
17.0000 g | PACK | Freq: Two times a day (BID) | ORAL | Status: DC
  Administered 2024-03-06 – 2024-03-07 (×3): 17 g via ORAL
  Filled 2024-03-06 (×3): qty 1

## 2024-03-06 NOTE — Plan of Care (Signed)
   Problem: Clinical Measurements: Goal: Will remain free from infection Outcome: Progressing Goal: Diagnostic test results will improve Outcome: Progressing

## 2024-03-06 NOTE — Progress Notes (Signed)
 1 Day Post-Op  Subjective: CC: Lauren Bradford was found in her room sitting up in chair at bedside with her husband in the room. Patient stated that her pain appears to be well controlled but experiences tenderness particularly with movements such as twisting or when getting OOB. However, the patient went for a walk with the nursing team this AM w/o difficulty. Patient is tolerating food well w/o n/v. Patient denied having a bowel movement but stated that she has a known issue of chronic constipation for which she take laxatives regularly. She endorses passing gas this AM.   Objective: Vital signs in last 24 hours: Temp:  [97.5 F (36.4 C)-100.1 F (37.8 C)] 97.6 F (36.4 C) (05/29 0927) Pulse Rate:  [71-108] 95 (05/29 0927) Resp:  [10-18] 18 (05/29 0927) BP: (100-152)/(61-90) 113/72 (05/29 0927) SpO2:  [90 %-100 %] 95 % (05/29 0927) Weight:  [104 kg] 104 kg (05/28 1106) Last BM Date : 03/02/24  Intake/Output from previous day: 05/28 0701 - 05/29 0700 In: 1911 [P.O.:390; I.V.:1321; IV Piggyback:200] Out: 450 [Urine:450] Intake/Output this shift: Total I/O In: 240 [P.O.:240] Out: 450 [Urine:450]  PE: Physical Exam Constitutional:      General: She is not in acute distress. Abdominal:     General: There is no distension.     Palpations: Abdomen is soft.     Tenderness: There is no guarding or rebound.     Comments: Mild TTP around incisions,  Skin:    General: Skin is warm and dry.  Neurological:     General: No focal deficit present.     Mental Status: She is alert.  Psychiatric:        Mood and Affect: Mood normal.     Lab Results:  Recent Labs    03/05/24 0215 03/06/24 0144  WBC 7.8 7.0  HGB 11.6* 10.7*  HCT 34.8* 32.6*  PLT 271 266   BMET Recent Labs    03/05/24 0215 03/06/24 0144  NA 133* 132*  K 3.2* 3.3*  CL 97* 97*  CO2 27 26  GLUCOSE 177* 139*  BUN 6 8  CREATININE 0.59 0.72  CALCIUM 8.5* 8.2*   PT/INR No results for input(s): "LABPROT",  "INR" in the last 72 hours. CMP     Component Value Date/Time   NA 132 (L) 03/06/2024 0144   NA 136 02/22/2014 1557   K 3.3 (L) 03/06/2024 0144   K 3.0 (L) 02/22/2014 1557   CL 97 (L) 03/06/2024 0144   CL 101 02/22/2014 1557   CO2 26 03/06/2024 0144   CO2 27 02/22/2014 1557   GLUCOSE 139 (H) 03/06/2024 0144   GLUCOSE 94 02/22/2014 1557   BUN 8 03/06/2024 0144   BUN 10 02/22/2014 1557   CREATININE 0.72 03/06/2024 0144   CREATININE 0.78 02/22/2014 1557   CALCIUM 8.2 (L) 03/06/2024 0144   CALCIUM 10.0 02/22/2014 1557   PROT 7.1 03/06/2024 0144   PROT 8.3 (H) 02/22/2014 1557   ALBUMIN 3.1 (L) 03/06/2024 0144   ALBUMIN 3.9 02/22/2014 1557   AST 27 03/06/2024 0144   AST 14 (L) 02/22/2014 1557   ALT 25 03/06/2024 0144   ALT 16 02/22/2014 1557   ALKPHOS 98 03/06/2024 0144   ALKPHOS 120 (H) 02/22/2014 1557   BILITOT 0.7 03/06/2024 0144   BILITOT 0.4 02/22/2014 1557   GFRNONAA >60 03/06/2024 0144   GFRNONAA >60 02/22/2014 1557   GFRAA >60 06/15/2020 1714   GFRAA >60 02/22/2014 1557   Lipase  Component Value Date/Time   LIPASE 34 03/03/2024 0844    Studies/Results: NM Hepatobiliary Liver Func Result Date: 03/04/2024 CLINICAL DATA:  RIGHT upper quadrant pain.  Cholelithiasis. EXAM: NUCLEAR MEDICINE HEPATOBILIARY IMAGING TECHNIQUE: Sequential images of the abdomen were obtained out to 60 minutes following intravenous administration of radiopharmaceutical. RADIOPHARMACEUTICALS:  5.3 mCi Tc-33m  Choletec IV COMPARISON:  Ultrasound CT 03/04/2019 FINDINGS: Prompt clearance radiotracer from blood pool and homogeneous uptake in liver. Counts are evident in the common bile duct and small bowel by 40 minutes. The gallbladder fails to fill at 45 minutes. 3 mg IV morphine  was administered to augment filling of the gallbladder. No filling of the gallbladder post morphine  augmentation. IMPRESSION: Non filling of the gallbladder most consistent with cystic duct obstruction / acute  cholecystitis. These results will be called to the ordering clinician or representative by the Radiologist Assistant, and communication documented in the PACS or [employer]. Electronically Signed   By: Deboraha Fallow M.D.   On: 03/04/2024 14:30    Anti-infectives: Anti-infectives (From admission, onward)    Start     Dose/Rate Route Frequency Ordered Stop   03/05/24 2000  ceFAZolin  (ANCEF ) IVPB 2g/100 mL premix        2 g 200 mL/hr over 30 Minutes Intravenous Every 8 hours 03/05/24 1559     03/05/24 1115  ceFAZolin  (ANCEF ) IVPB 2g/100 mL premix        2 g 200 mL/hr over 30 Minutes Intravenous On call to O.R. 03/05/24 1028 03/05/24 1248   03/05/24 1051  ceFAZolin  (ANCEF ) 2-4 GM/100ML-% IVPB       Note to Pharmacy: Arita Belch: cabinet override      03/05/24 1051 03/05/24 1256        Assessment/Plan LAPAROSCOPIC CHOLECYSTECTOMY WITH INTRAOPERATIVE CHOLANGIOGRAM on 5/28, Dr. Sofia Dunn  Lauren Bradford appears comfortable in NAD. Pain is well controlled with tylenol  and oxycodone . Patient is expected to finish her IV ancef  today prior to DC. WBC normal, mild hypokalemia and with mild anemia (not uncommon post-op).   FEN - Regular diet VTE - Patient is active and OOB, SCDs ID - Ancef  IV to be discontinued today (5/29).  Foley - None Plan - Patient is cleared to be d/c'd from a general surgery point of view.   I reviewed last 24 h vitals and pain scores, last 48 h intake and output, last 24 h labs and trends, and last 24 h imaging results.  This care required moderate level of medical decision making.    LOS: 0 days    Mirta Ammon, [facility] 03/06/2024, 10:20 AM Please see Amion for pager number during day hours 7:00am-4:30pm

## 2024-03-06 NOTE — Progress Notes (Signed)
 Mobility Specialist - Progress Note   03/06/24 0954  Mobility  Activity Ambulated with assistance in hallway  Level of Assistance Contact guard assist, steadying assist  Assistive Device  ([place])  Distance Ambulated (ft) 600 ft  Activity Response Tolerated well  Mobility Referral Yes  Mobility visit 1 Mobility  Mobility Specialist Start Time (ACUTE ONLY) [id number]  Mobility Specialist Stop Time (ACUTE ONLY) 0953  Mobility Specialist Time Calculation (min) (ACUTE ONLY) 16 min   Pt received in recliner and agreeable to mobility. Pt required verbal cues throughout session. No complaints during session. Pt to recliner after session with all needs met.    [facility]

## 2024-03-06 NOTE — Progress Notes (Signed)
 PROGRESS NOTE    Lauren Bradford  WJX:[id number] DOB: June 25, 1976 DOA: 03/03/2024 PCP: Danella Dunn, MD   Brief Narrative:  48 year old woman with history of of asthma, glaucoma, blindness, diabetes mellitus type 2, chronic right lower pain with acute onset epigastric pain, right upper quadrant.  Imaging at intake concerning for adnexal mass as well as cholecystitis given HIDA scan, general surgery called in consult.  OB/GYN also called in consult recommending outpatient follow-up.  Assessment & Plan:   Principal Problem:   Acute cholecystitis Active Problems:   Asthma   Right ovarian cyst   Pancreatic lesion   Type 2 diabetes mellitus (HCC)   Glaucoma   Aortic atherosclerosis (HCC)   Acute cholecystitis Epigastric and right upper quadrant pain Confirmed on CT/HIDA scan General Surgery following, laparoscopic cholecystectomy 5/28- tolerated well Pain currently well-controlled, advance diet postoperatively per general surgery   Constipation Secondary to above, as well as ongoing narcotic use Increase miralax /sennakot   Right adnexal mass Multiple specialties involved including gynecology, neurosurgery, currently recommending further outpatient follow-up for further evaluation, imaging and scheduling   Progression of retroperitoneal and right common iliac adenopathy Nonspecific, lymphoproliferative disorder could not be confirmed or ruled out -follow-up outpatient as discussed   Pancreatic cystic lesion CT showed stable 2.4 cm well-defined homogeneous cystic lesion arising from the uncinate process of the pancreas. Imaging features are nonspecific -currently recommending nonemergent MRI abdomen with and without contrast in the outpatient setting.   Hypertension Stable.   Type II DM, uncontrolled with hyperglycemia Continue sliding scale insulin , hypoglycemic protocol  Unclear the patient will need insulin  ongoing given minimal usage over the past 24 hours Lengthy  discussion in regards to patient's lifestyle and dietary changes.   Asthma  Appears stable.  Not currently in exacerbation   Glaucoma Legally blind, continue home eyedrops   Aortic Atherosclerosis  Does not appear to be on antiplatelet or statin, defer to PCP  DVT prophylaxis: SCDs Start: 03/03/24 1804 Code Status:   Code Status: Full Code Family Communication: None present  Status is: Inpatient  Dispo: The patient is from: Home              Anticipated d/c is to: Home              Anticipated d/c date is: 24 to 48 hours              Patient currently not medically stable for discharge  Consultants:  General Surgery  Procedures:  Laparoscopic cholecystectomy  Antimicrobials:  Perioperatively  Subjective: No acute issues or events overnight denies nausea vomiting diarrhea headache fevers chills or chest pain. Tolerated procedure well - complaining of constipation and abdominal pain/distention.  Objective: Vitals:   03/05/24 1932 03/05/24 2044 03/06/24 0217 03/06/24 0538  BP: 102/65 100/61 111/81 125/81  Pulse: 88 85 94 91  Resp: 18 18 18 18   Temp: 98.2 F (36.8 C) 98.3 F (36.8 C) 98.4 F (36.9 C) 98.3 F (36.8 C)  TempSrc: Oral Oral  Oral  SpO2: 95% 94% 95% 97%  Weight:      Height:        Intake/Output Summary (Last 24 hours) at 03/06/2024 0819 Last data filed at 03/06/2024 0600 Gross per 24 hour  Intake 1911 ml  Output 450 ml  Net 1461 ml   Filed Weights   03/03/24 0939 03/05/24 1106  Weight: 104.8 kg 104 kg    Examination:  General:  Pleasantly resting in bed, No acute distress. HEENT:  Normocephalic atraumatic.  Sclerae nonicteric, noninjected.  Extraocular movements intact bilaterally. Neck:  Without mass or deformity.  Trachea is midline. Lungs:  Clear to auscultate bilaterally without rhonchi, wheeze, or rales. Heart:  Regular rate and rhythm.  Without murmurs, rubs, or gallops. Abdomen:  Soft, nontender, nondistended.  Without guarding or  rebound. Extremities: Without cyanosis, clubbing, edema, or obvious deformity. Skin:  Warm and dry, no erythema.   Data Reviewed: I have personally reviewed following labs and imaging studies  CBC: Recent Labs  Lab 03/03/24 0844 03/04/24 0422 03/05/24 0215 03/06/24 0144  WBC 6.2 7.5 7.8 7.0  NEUTROABS 4.4  --   --   --   HGB 11.7* 12.2 11.6* 10.7*  HCT 35.9* 36.3 34.8* 32.6*  MCV 87.1 86.8 88.1 88.8  PLT 312 301 271 266   Basic Metabolic Panel: Recent Labs  Lab 03/03/24 0844 03/04/24 0422 03/05/24 0215 03/06/24 0144  NA 132* 134* 133* 132*  K 3.5 3.4* 3.2* 3.3*  CL 96* 97* 97* 97*  CO2 26 25 27 26   GLUCOSE 242* 205* 177* 139*  BUN 12 6 6 8   CREATININE 0.84 0.57 0.59 0.72  CALCIUM 9.2 9.1 8.5* 8.2*   GFR: Estimated Creatinine Clearance: 100.7 mL/min (by C-G formula based on SCr of 0.72 mg/dL). Liver Function Tests: Recent Labs  Lab 03/03/24 0844 03/04/24 0422 03/05/24 0215 03/06/24 0144  AST 15 15 13* 27  ALT 18 16 14 25   ALKPHOS 121 113 107 98  BILITOT 0.7 0.7 0.6 0.7  PROT 7.7 7.0 7.0 7.1  ALBUMIN 3.8 3.4* 3.1* 3.1*   Recent Labs  Lab 03/03/24 0844  LIPASE 34   HbA1C: Recent Labs    03/04/24 0422  HGBA1C 8.0*   CBG: Recent Labs  Lab 03/05/24 1612 03/05/24 2042 03/05/24 2323 03/06/24 0344 03/06/24 0736  GLUCAP 216* 215* 143* 142* 154*   No results found for this or any previous visit (from the past 240 hours).   Radiology Studies: NM Hepatobiliary Liver Func Result Date: 03/04/2024 CLINICAL DATA:  RIGHT upper quadrant pain.  Cholelithiasis. EXAM: NUCLEAR MEDICINE HEPATOBILIARY IMAGING TECHNIQUE: Sequential images of the abdomen were obtained out to 60 minutes following intravenous administration of radiopharmaceutical. RADIOPHARMACEUTICALS:  5.3 mCi Tc-45m  Choletec  IV COMPARISON:  Ultrasound CT 03/04/2019 FINDINGS: Prompt clearance radiotracer from blood pool and homogeneous uptake in liver. Counts are evident in the common bile duct and  small bowel by 40 minutes. The gallbladder fails to fill at 45 minutes. 3 mg IV morphine  was administered to augment filling of the gallbladder. No filling of the gallbladder post morphine  augmentation. IMPRESSION: Non filling of the gallbladder most consistent with cystic duct obstruction / acute cholecystitis. These results will be called to the ordering clinician or representative by the Radiologist Assistant, and communication documented in the PACS or [employer]. Electronically Signed   By: Deboraha Fallow M.D.   On: 03/04/2024 14:30    Scheduled Meds:  acetaminophen   1,000 mg Oral Q6H   brimonidine   1 drop Both Eyes TID   Difluprednate   1 drop Right Eye BID   insulin  aspart  0-6 Units Subcutaneous Q4H   latanoprost   1 drop Both Eyes QHS   pantoprazole   40 mg Oral Daily   sodium chloride  flush  10-40 mL Intracatheter Q12H   sodium chloride  flush  3 mL Intravenous Q12H   Continuous Infusions:   ceFAZolin  (ANCEF ) IV 2 g (03/06/24 0501)    LOS: 0 days   Time spent:  Haydee Lipa,  DO [facility]  If 7PM-7AM, please contact night-coverage [email or web address]  03/06/2024, 8:19 AM

## 2024-03-07 ENCOUNTER — Other Ambulatory Visit (HOSPITAL_COMMUNITY): Payer: Self-pay

## 2024-03-07 DIAGNOSIS — K8001 Calculus of gallbladder with acute cholecystitis with obstruction: Secondary | ICD-10-CM | POA: Diagnosis not present

## 2024-03-07 DIAGNOSIS — K810 Acute cholecystitis: Secondary | ICD-10-CM | POA: Diagnosis not present

## 2024-03-07 LAB — CBC
HCT: 35.9 % — ABNORMAL LOW (ref 36.0–46.0)
Hemoglobin: 11.6 g/dL — ABNORMAL LOW (ref 12.0–15.0)
MCH: 29 pg (ref 26.0–34.0)
MCHC: 32.3 g/dL (ref 30.0–36.0)
MCV: 89.8 fL (ref 80.0–100.0)
Platelets: UNDETERMINED 10*3/uL (ref 150–400)
RBC: 4 MIL/uL (ref 3.87–5.11)
RDW: 13.4 % (ref 11.5–15.5)
WBC: 5.4 10*3/uL (ref 4.0–10.5)
nRBC: 0 % (ref 0.0–0.2)

## 2024-03-07 LAB — COMPREHENSIVE METABOLIC PANEL WITH GFR
ALT: 22 U/L (ref 0–44)
AST: 22 U/L (ref 15–41)
Albumin: 3.1 g/dL — ABNORMAL LOW (ref 3.5–5.0)
Alkaline Phosphatase: 105 U/L (ref 38–126)
Anion gap: 11 (ref 5–15)
BUN: 7 mg/dL (ref 6–20)
CO2: 25 mmol/L (ref 22–32)
Calcium: 8.8 mg/dL — ABNORMAL LOW (ref 8.9–10.3)
Chloride: 98 mmol/L (ref 98–111)
Creatinine, Ser: 0.7 mg/dL (ref 0.44–1.00)
GFR, Estimated: >60 mL/min (ref >60)
Glucose, Bld: 166 mg/dL — ABNORMAL HIGH (ref 70–99)
Potassium: 3.3 mmol/L — ABNORMAL LOW (ref 3.5–5.1)
Sodium: 134 mmol/L — ABNORMAL LOW (ref 135–145)
Total Bilirubin: 0.4 mg/dL (ref 0.0–1.2)
Total Protein: 7.1 g/dL (ref 6.5–8.1)

## 2024-03-07 LAB — GLUCOSE, CAPILLARY
Glucose-Capillary: 143 mg/dL — ABNORMAL HIGH (ref 70–99)
Glucose-Capillary: 166 mg/dL — ABNORMAL HIGH (ref 70–99)
Glucose-Capillary: 178 mg/dL — ABNORMAL HIGH (ref 70–99)

## 2024-03-07 LAB — SURGICAL PATHOLOGY

## 2024-03-07 MED ORDER — POLYETHYLENE GLYCOL 3350 17 GM/SCOOP PO POWD
238.0000 g | Freq: Two times a day (BID) | ORAL | 0 refills | Status: AC
  Filled 2024-03-07: qty 238, 14d supply, fill #0

## 2024-03-07 MED ORDER — OXYCODONE HCL 5 MG PO TABS
5.0000 mg | ORAL_TABLET | ORAL | 0 refills | Status: AC | PRN
  Filled 2024-03-07: qty 30, 3d supply, fill #0

## 2024-03-07 MED ORDER — PANTOPRAZOLE SODIUM 40 MG PO TBEC
40.0000 mg | DELAYED_RELEASE_TABLET | Freq: Every day | ORAL | 0 refills | Status: AC
  Filled 2024-03-07: qty 30, 30d supply, fill #0

## 2024-03-07 MED ORDER — SENNOSIDES-DOCUSATE SODIUM 8.6-50 MG PO TABS
1.0000 | ORAL_TABLET | Freq: Two times a day (BID) | ORAL | 0 refills | Status: AC
  Filled 2024-03-07: qty 60, 30d supply, fill #0

## 2024-03-07 MED ORDER — POTASSIUM CHLORIDE 20 MEQ PO PACK
40.0000 meq | PACK | Freq: Once | ORAL | Status: AC
  Administered 2024-03-07: 40 meq via ORAL
  Filled 2024-03-07: qty 2

## 2024-03-07 MED ORDER — METHOCARBAMOL 500 MG PO TABS
500.0000 mg | ORAL_TABLET | Freq: Two times a day (BID) | ORAL | 0 refills | Status: DC | PRN
  Filled 2024-03-07: qty 10, 5d supply, fill #0

## 2024-03-07 NOTE — Progress Notes (Signed)
 Mobility Specialist - Progress Note   03/07/24 1003  Mobility  Activity Ambulated independently in hallway  Level of Assistance Standby assist, set-up cues, supervision of patient - no hands on  Assistive Device Other (Comment) (Blind cane)  Distance Ambulated (ft) 500 ft  Range of Motion/Exercises Active  Activity Response Tolerated well  Mobility Referral Yes  Mobility visit 1 Mobility  Mobility Specialist Start Time (ACUTE ONLY) [id number]  Mobility Specialist Stop Time (ACUTE ONLY) 1003  Mobility Specialist Time Calculation (min) (ACUTE ONLY) 10 min   Pt was found in room and agreeable to ambulate. No complaints and returned to room with all needs met. Husband in room.  Lorna Rose,  Mobility Specialist Can be reached via Secure Chat

## 2024-03-07 NOTE — Progress Notes (Signed)
 Discharge instructions given to patient and husband, questions asked and answered, Discharge medications delivered to patient at bedside. SCAT ride called per patient for 1245 pick up. Sylvia Everts RN

## 2024-03-07 NOTE — Progress Notes (Signed)
 2 Days Post-Op  Subjective: CC: Lauren Bradford was found laying in bed with her husband at bedside. Patient rated her pain at 5/10 especially in the mid abdomen.Patient remains mildly tender around incisions. Patient remains mobile. She endorses having a bowel movement overnight with some nausea earlier this AM which was resolved with a dose of zofran  but no emesis. Patient endorses flatus.  Patient is tolerating food well overall .   Objective: Vital signs in last 24 hours: Temp:  [97.6 F (36.4 C)-100.3 F (37.9 C)] 100.3 F (37.9 C) (05/30 0411) Pulse Rate:  [94-101] 101 (05/30 0411) Resp:  [18] 18 (05/30 0411) BP: (113-129)/(61-77) 123/61 (05/30 0411) SpO2:  [93 %-97 %] 93 % (05/30 0411) Last BM Date : 03/02/24  Intake/Output from previous day: 05/29 0701 - 05/30 0700 In: 1380 [P.O.:1080; IV Piggyback:300] Out: 1350 [Urine:1350] Intake/Output this shift: No intake/output data recorded.  PE: Physical Exam Constitutional:      General: She is not in acute distress. Abdominal:     General: There is no distension.     Palpations: Abdomen is soft.     Tenderness: There is no guarding or rebound.     Comments: Mild TTP around incisions,  Skin:    General: Skin is warm and dry.  Neurological:     General: No focal deficit present.     Mental Status: She is alert.  Psychiatric:        Mood and Affect: Mood normal.     Lab Results:  Recent Labs    03/06/24 0144 03/07/24 0434  WBC 7.0 5.4  HGB 10.7* 11.6*  HCT 32.6* 35.9*  PLT 266 PLATELET CLUMPS NOTED ON SMEAR, UNABLE TO ESTIMATE   BMET Recent Labs    03/06/24 0144 03/07/24 0614  NA 132* 134*  K 3.3* 3.3*  CL 97* 98  CO2 26 25  GLUCOSE 139* 166*  BUN 8 7  CREATININE 0.72 0.70  CALCIUM 8.2* 8.8*   PT/INR No results for input(s): "LABPROT", "INR" in the last 72 hours. CMP     Component Value Date/Time   NA 134 (L) 03/07/2024 0614   NA 136 02/22/2014 1557   K 3.3 (L) 03/07/2024 0614   K 3.0 (L)  02/22/2014 1557   CL 98 03/07/2024 0614   CL 101 02/22/2014 1557   CO2 25 03/07/2024 0614   CO2 27 02/22/2014 1557   GLUCOSE 166 (H) 03/07/2024 0614   GLUCOSE 94 02/22/2014 1557   BUN 7 03/07/2024 0614   BUN 10 02/22/2014 1557   CREATININE 0.70 03/07/2024 0614   CREATININE 0.78 02/22/2014 1557   CALCIUM 8.8 (L) 03/07/2024 0614   CALCIUM 10.0 02/22/2014 1557   PROT 7.1 03/07/2024 0614   PROT 8.3 (H) 02/22/2014 1557   ALBUMIN 3.1 (L) 03/07/2024 0614   ALBUMIN 3.9 02/22/2014 1557   AST 22 03/07/2024 0614   AST 14 (L) 02/22/2014 1557   ALT 22 03/07/2024 0614   ALT 16 02/22/2014 1557   ALKPHOS 105 03/07/2024 0614   ALKPHOS 120 (H) 02/22/2014 1557   BILITOT 0.4 03/07/2024 0614   BILITOT 0.4 02/22/2014 1557   GFRNONAA >60 03/07/2024 0614   GFRNONAA >60 02/22/2014 1557   GFRAA >60 06/15/2020 1714   GFRAA >60 02/22/2014 1557   Lipase     Component Value Date/Time   LIPASE 34 03/03/2024 0844    Studies/Results: No results found.   Anti-infectives: Anti-infectives (From admission, onward)    Start  Dose/Rate Route Frequency Ordered Stop   03/05/24 2000  ceFAZolin  (ANCEF ) IVPB 2g/100 mL premix        2 g 200 mL/hr over 30 Minutes Intravenous Every 8 hours 03/05/24 1559     03/05/24 1115  ceFAZolin  (ANCEF ) IVPB 2g/100 mL premix        2 g 200 mL/hr over 30 Minutes Intravenous On call to O.R. 03/05/24 1028 03/05/24 1248   03/05/24 1051  ceFAZolin  (ANCEF ) 2-4 GM/100ML-% IVPB       Note to Pharmacy: Arita Belch: cabinet override      03/05/24 1051 03/05/24 1256        Assessment/Plan LAPAROSCOPIC CHOLECYSTECTOMY WITH INTRAOPERATIVE CHOLANGIOGRAM on 5/28, Dr. Sofia Dunn  Lauren Lanagan remains comfortable and in NAD. Pain is controlled with tylenol  and oxycodone . Patient is off IV abx. WBC normal, mild but stable hypokalemia with resolve anemia .   FEN - Regular diet VTE - Patient is active and OOB, SCDs ID - Ancef  IV to be discontinued today (5/29).  Foley -  None Plan - Patient remains stable for d/c provided that pain remains stable and no signs of GI immotility.   I reviewed last 24 h vitals and pain scores, last 48 h intake and output, last 24 h labs and trends, and last 24 h imaging results.  This care required moderate level of medical decision making.      Mirta Ammon, [facility] 03/07/2024, 8:23 AM Please see Amion for pager number during day hours 7:00am-4:30pm

## 2024-03-07 NOTE — Discharge Summary (Signed)
 Physician Discharge Summary  NAVY ROTHSCHILD YQM:[id number] DOB: 1976/07/27 DOA: 03/03/2024  PCP: Danella Dunn, MD  Admit date: 03/03/2024 Discharge date: 03/07/2024  Admitted From: Home  Disposition: Home  Recommendations for Outpatient Follow-up:  Follow up with PCP in 1-2 weeks Follow-up with general surgery as scheduled  Home Health: None Equipment/Devices: None  Discharge Condition: Stable CODE STATUS: Full Diet recommendation: Low-fat low-carb diet  Brief/Interim Summary: 48 year old woman with history of of asthma, glaucoma, blindness, diabetes mellitus type 2, chronic right lower pain with acute onset epigastric pain, right upper quadrant.  Imaging at intake concerning for adnexal mass as well as cholecystitis given HIDA scan, general surgery called in consult.  OB/GYN also called in consult recommending outpatient follow-up.  Patient admitted as above with acute onset epigastric pain found to have acute cholecystitis status post lap chole 03/06/2024, tolerated quite well.  Tolerating p.o. now pain well-controlled. During hospitalization incidentally noted right adnexal mass was discussed with gynecology and neurosurgery who recommended further outpatient evaluation for further imaging and scheduling per their expertise.  Patient otherwise stable and agreeable for discharge home.    Discharge Diagnoses:  Principal Problem:   Acute cholecystitis Active Problems:   Asthma   Right ovarian cyst   Pancreatic lesion   Type 2 diabetes mellitus (HCC)   Glaucoma   Aortic atherosclerosis (HCC)  Acute cholecystitis Epigastric and right upper quadrant pain Confirmed on CT/HIDA scan Lap chole tolerated well Pain currently well-controlled, advance diet postoperatively per general surgery   Constipation, resolved Secondary to above, as well as ongoing narcotic use Increase miralax/sennakot    Right adnexal mass Multiple specialties contacted at admission including gynecology,  neurosurgery, currently recommending further outpatient follow-up for further evaluation, imaging and scheduling   Progression of retroperitoneal and right common iliac adenopathy Nonspecific, lymphoproliferative disorder could not be confirmed or ruled out - follow-up outpatient as discussed for repeat imaging   Pancreatic cystic lesion CT showed stable 2.4 cm well-defined homogeneous cystic lesion arising from the uncinate process of the pancreas. Imaging features are nonspecific -currently recommending nonemergent MRI abdomen with and without contrast in the outpatient setting.   Hypertension Stable.   Type II DM, uncontrolled with hyperglycemia Continue sliding scale insulin , hypoglycemic protocol  Unclear the patient will need insulin  ongoing given minimal usage over the past 24 hours Lengthy discussion in regards to patient's lifestyle and dietary changes.   Asthma  Appears stable.  Not currently in exacerbation   Glaucoma Legally blind, continue home eyedrops   Aortic Atherosclerosis  Does not appear to be on antiplatelet or statin, defer to PCP  Discharge Instructions  Discharge Instructions     Call MD for:  extreme fatigue   Complete by: As directed    Call MD for:  hives   Complete by: As directed    Call MD for:  persistant dizziness or light-headedness   Complete by: As directed    Call MD for:  persistant nausea and vomiting   Complete by: As directed    Call MD for:  redness, tenderness, or signs of infection (pain, swelling, redness, odor or green/yellow discharge around incision site)   Complete by: As directed    Call MD for:  severe uncontrolled pain   Complete by: As directed    Call MD for:  temperature >100.4   Complete by: As directed    Diet - low sodium heart healthy   Complete by: As directed    Increase activity slowly   Complete by: As  directed       Allergies as of 03/07/2024       Reactions   Prednisone Hives, Itching, Swelling    Latex Rash   Yeast-derived Drug Products Rash   Wheezing        Medication List     STOP taking these medications    ibuprofen  600 MG tablet Commonly known as: ADVIL    omeprazole  20 MG capsule Commonly known as: PRILOSEC Replaced by: pantoprazole 40 MG tablet   potassium chloride  SA 20 MEQ tablet Commonly known as: Klor-Con  M20       TAKE these medications    albuterol  108 (90 Base) MCG/ACT inhaler Commonly known as: VENTOLIN  HFA Inhale 2 puffs into the lungs every 6 (six) hours as needed for wheezing or shortness of breath.   brimonidine  0.1 % Soln Commonly known as: ALPHAGAN  P Place 1 drop into both eyes in the morning and at bedtime.   Difluprednate  0.05 % Emul Place 1 drop into the right eye 2 (two) times daily.   fluticasone 50 MCG/ACT nasal spray Commonly known as: FLONASE Place 1 spray into both nostrils daily.   methocarbamol  500 MG tablet Commonly known as: ROBAXIN  Take 1 tablet (500 mg total) by mouth 2 (two) times daily as needed for muscle spasms.   olmesartan-hydrochlorothiazide  40-25 MG tablet Commonly known as: BENICAR HCT Take 1 tablet by mouth daily.   ondansetron  4 MG disintegrating tablet Commonly known as: ZOFRAN -ODT Take 1 tablet (4 mg total) by mouth every 8 (eight) hours as needed for nausea or vomiting. What changed: when to take this   oxyCODONE  5 MG immediate release tablet Commonly known as: Oxy IR/ROXICODONE  Take 1-2 tablets (5-10 mg total) by mouth every 4 (four) hours as needed for up to 3 days for moderate pain (pain score 4-6) or severe pain (pain score 7-10).   pantoprazole 40 MG tablet Commonly known as: PROTONIX Take 1 tablet (40 mg total) by mouth daily. Start taking on: Mar 08, 2024 Replaces: omeprazole  20 MG capsule   polyethylene glycol powder 17 GM/SCOOP powder Commonly known as: GLYCOLAX/MIRALAX Take 17 grams dissolved in liquid by mouth 2 (two) times daily.   Stool Softener/Laxative 50-8.6 MG  tablet Generic drug: senna-docusate Take 1 tablet by mouth 2 (two) times daily.   Tresiba FlexTouch 100 UNIT/ML FlexTouch Pen Generic drug: insulin  degludec Inject 40 Units into the skin at bedtime.        Follow-up Information     Maczis, Puja Gosai, PA-C Follow up on 03/26/2024.   Specialty: General Surgery Why: 6/18 at 1:45. Please bring a copy of your photo ID, insurance card and arrive 30 minutes prior to your appointment for paperwork. Contact information: [address] [place] [address] [phone]                Allergies  Allergen Reactions   Prednisone Hives, Itching and Swelling   Latex Rash   Yeast-Derived Drug Products Rash    Wheezing     Consultations: General Surgery   Procedures/Studies: NM Hepatobiliary Liver Func Result Date: 03/04/2024 CLINICAL DATA:  RIGHT upper quadrant pain.  Cholelithiasis. EXAM: NUCLEAR MEDICINE HEPATOBILIARY IMAGING TECHNIQUE: Sequential images of the abdomen were obtained out to 60 minutes following intravenous administration of radiopharmaceutical. RADIOPHARMACEUTICALS:  5.3 mCi Tc-32m  Choletec IV COMPARISON:  Ultrasound CT 03/04/2019 FINDINGS: Prompt clearance radiotracer from blood pool and homogeneous uptake in liver. Counts are evident in the common bile duct and small bowel by 40 minutes.  The gallbladder fails to fill at 45 minutes. 3 mg IV morphine  was administered to augment filling of the gallbladder. No filling of the gallbladder post morphine  augmentation. IMPRESSION: Non filling of the gallbladder most consistent with cystic duct obstruction / acute cholecystitis. These results will be called to the ordering clinician or representative by the Radiologist Assistant, and communication documented in the PACS or [employer]. Electronically Signed   By: Deboraha Fallow M.D.   On: 03/04/2024 14:30   US  Pelvis Complete Result Date: 03/03/2024 CLINICAL DATA:  Right-sided abdominal and pelvic pain.  Right adnexal cystic lesion on recent CT. EXAM: TRANSABDOMINAL AND TRANSVAGINAL ULTRASOUND OF PELVIS DOPPLER ULTRASOUND OF OVARIES TECHNIQUE: Both transabdominal and transvaginal ultrasound examinations of the pelvis were performed. Transabdominal technique was performed for global imaging of the pelvis including uterus, ovaries, adnexal regions, and pelvic cul-de-sac. It was necessary to proceed with endovaginal exam following the transabdominal exam to visualize the right adnexal lesion. Color and duplex Doppler ultrasound was utilized to evaluate blood flow to the ovaries. COMPARISON:  CT on 03/03/2024 FINDINGS: Uterus Measurements: 7.2 x 4.5 x 5.2 cm = volume: 89 mL. Diffusely heterogeneous myometrial echotexture is seen, without discrete myometrial masses. Endometrium Thickness: 7 mm. Right ovary Measurements: No normal ovary visualized. A complex cystic lesion is seen in the right adnexa measuring 6.6 x 4.1 x 4.8 cm. This lesion has multiple internal septations, some of which show mild thickening and internal blood flow on color and duplex Doppler. Differential diagnosis includes cystic ovarian neoplasm, hydro-pyo-salpinx, and endometrioma. Left ovary Measurements: 2.3 x 2.2 x 2.1 cm = volume: 5.5 mL. Normal appearance/no adnexal mass. Pulsed Doppler evaluation of both ovaries demonstrates normal low-resistance arterial and venous waveforms. Other findings No abnormal free fluid. IMPRESSION: 6.6 cm complex cystic lesion in the right adnexa, with multiple mildly thickened septations but no soft tissue component. Differential diagnosis includes cystic ovarian neoplasm, however, hydrosalpinx, and endometrioma. No sonographic evidence for ovarian torsion. Electronically Signed   By: Marlyce Sine M.D.   On: 03/03/2024 13:38   US  Transvaginal Non-OB Result Date: 03/03/2024 CLINICAL DATA:  Right-sided abdominal and pelvic pain. Right adnexal cystic lesion on recent CT. EXAM: TRANSABDOMINAL AND TRANSVAGINAL  ULTRASOUND OF PELVIS DOPPLER ULTRASOUND OF OVARIES TECHNIQUE: Both transabdominal and transvaginal ultrasound examinations of the pelvis were performed. Transabdominal technique was performed for global imaging of the pelvis including uterus, ovaries, adnexal regions, and pelvic cul-de-sac. It was necessary to proceed with endovaginal exam following the transabdominal exam to visualize the right adnexal lesion. Color and duplex Doppler ultrasound was utilized to evaluate blood flow to the ovaries. COMPARISON:  CT on 03/03/2024 FINDINGS: Uterus Measurements: 7.2 x 4.5 x 5.2 cm = volume: 89 mL. Diffusely heterogeneous myometrial echotexture is seen, without discrete myometrial masses. Endometrium Thickness: 7 mm. Right ovary Measurements: No normal ovary visualized. A complex cystic lesion is seen in the right adnexa measuring 6.6 x 4.1 x 4.8 cm. This lesion has multiple internal septations, some of which show mild thickening and internal blood flow on color and duplex Doppler. Differential diagnosis includes cystic ovarian neoplasm, hydro-pyo-salpinx, and endometrioma. Left ovary Measurements: 2.3 x 2.2 x 2.1 cm = volume: 5.5 mL. Normal appearance/no adnexal mass. Pulsed Doppler evaluation of both ovaries demonstrates normal low-resistance arterial and venous waveforms. Other findings No abnormal free fluid. IMPRESSION: 6.6 cm complex cystic lesion in the right adnexa, with multiple mildly thickened septations but no soft tissue component. Differential diagnosis includes cystic ovarian neoplasm, however, hydrosalpinx, and endometrioma. No  sonographic evidence for ovarian torsion. Electronically Signed   By: Marlyce Sine M.D.   On: 03/03/2024 13:38   US  Art/Ven Flow Abd Pelv Doppler Result Date: 03/03/2024 CLINICAL DATA:  Right-sided abdominal and pelvic pain. Right adnexal cystic lesion on recent CT. EXAM: TRANSABDOMINAL AND TRANSVAGINAL ULTRASOUND OF PELVIS DOPPLER ULTRASOUND OF OVARIES TECHNIQUE: Both  transabdominal and transvaginal ultrasound examinations of the pelvis were performed. Transabdominal technique was performed for global imaging of the pelvis including uterus, ovaries, adnexal regions, and pelvic cul-de-sac. It was necessary to proceed with endovaginal exam following the transabdominal exam to visualize the right adnexal lesion. Color and duplex Doppler ultrasound was utilized to evaluate blood flow to the ovaries. COMPARISON:  CT on 03/03/2024 FINDINGS: Uterus Measurements: 7.2 x 4.5 x 5.2 cm = volume: 89 mL. Diffusely heterogeneous myometrial echotexture is seen, without discrete myometrial masses. Endometrium Thickness: 7 mm. Right ovary Measurements: No normal ovary visualized. A complex cystic lesion is seen in the right adnexa measuring 6.6 x 4.1 x 4.8 cm. This lesion has multiple internal septations, some of which show mild thickening and internal blood flow on color and duplex Doppler. Differential diagnosis includes cystic ovarian neoplasm, hydro-pyo-salpinx, and endometrioma. Left ovary Measurements: 2.3 x 2.2 x 2.1 cm = volume: 5.5 mL. Normal appearance/no adnexal mass. Pulsed Doppler evaluation of both ovaries demonstrates normal low-resistance arterial and venous waveforms. Other findings No abnormal free fluid. IMPRESSION: 6.6 cm complex cystic lesion in the right adnexa, with multiple mildly thickened septations but no soft tissue component. Differential diagnosis includes cystic ovarian neoplasm, however, hydrosalpinx, and endometrioma. No sonographic evidence for ovarian torsion. Electronically Signed   By: Marlyce Sine M.D.   On: 03/03/2024 13:38   US  Abdomen Limited RUQ (LIVER/GB) Result Date: 03/03/2024 CLINICAL DATA:  Right upper quadrant pain for a day EXAM: ULTRASOUND ABDOMEN LIMITED RIGHT UPPER QUADRANT COMPARISON:  CT 03/02/2024. FINDINGS: Gallbladder: Distended gallbladder with layering stones. No wall thickening or adjacent fluid. No reported sonographic Murphy's sign.  Common bile duct: Diameter: 3 mm Liver: Diffusely echogenic hepatic parenchyma consistent with fatty liver infiltration. Portal vein is patent on color Doppler imaging with normal direction of blood flow towards the liver. Other: Evaluation limited by overlapping bowel gas and soft tissue. IMPRESSION: Distended gallbladder with stones. No further sonographic evidence of acute cholecystitis. No ductal dilatation. Fatty liver infiltration. Electronically Signed   By: Adrianna Horde M.D.   On: 03/03/2024 13:22   CT ABDOMEN PELVIS W CONTRAST Result Date: 03/03/2024 CLINICAL DATA:  Abdominal pain. EXAM: CT ABDOMEN AND PELVIS WITH CONTRAST TECHNIQUE: Multidetector CT imaging of the abdomen and pelvis was performed using the standard protocol following bolus administration of intravenous contrast. RADIATION DOSE REDUCTION: This exam was performed according to the departmental dose-optimization program which includes automated exposure control, adjustment of the mA and/or kV according to patient size and/or use of iterative reconstruction technique. CONTRAST:  OMNIPAQUE  IOHEXOL  300 MG/ML  SOLN COMPARISON:  08/12/2023 FINDINGS: Lower chest: Dependent atelectasis. Hepatobiliary: No suspicious focal abnormality within the liver parenchyma. Similar appearance of subcapsular low-density in the medial segment left liver, likely due to focal fatty deposition. Areas of fatty sparing seen along the subcapsular liver adjacent to the gallbladder fossa. Gallbladder wall is ill-defined with subtle pericholecystic edema. No calcified gallstones evident. No intrahepatic or extrahepatic biliary dilation. Pancreas: 2.4 cm well-defined homogeneous cystic lesion arising from the uncinate process of the pancreas is stable. No main pancreatic duct dilatation. Spleen: No splenomegaly. No suspicious focal mass lesion. Adrenals/Urinary Tract:  No adrenal nodule or mass. Kidneys unremarkable. No evidence for hydroureter. The urinary bladder  appears normal for the degree of distention. Stomach/Bowel: Stomach is unremarkable. No gastric wall thickening. No evidence of outlet obstruction. Duodenum is normally positioned as is the ligament of Treitz. No small bowel wall thickening. No small bowel dilatation. The terminal ileum is normal. The appendix is normal. No gross colonic mass. No colonic wall thickening. Mild left colonic diverticulosis without diverticulitis. Vascular/Lymphatic: There is mild atherosclerotic calcification of the abdominal aorta without aneurysm. Slight progression of gastrohepatic and hepatoduodenal ligament lymph nodes with 12 mm short axis HDL lymph node seen on 26/3 retrocrural low-density nodules again noted. The precaval node measured previously at 12 mm short axis is 14 mm today on 51/3. 15 mm short axis right common iliac node measured previously is 16 mm today on 63/3. Stranding is identified in the retroperitoneal fat around the aorta and IVC extending into the extraperitoneal fat along the common iliac vessels bilaterally. Reproductive: The uterus is unremarkable. No left adnexal mass. Interval development of a 5.2 x 4.6 x 6.3 cm mixed attenuation lesion in the right adnexal space potentially representing poly cystic change in the ovary versus a dilated tortuous right fallopian tube. Tubal ligation clips are noted bilaterally. Other: No intraperitoneal free fluid. Musculoskeletal: No worrisome lytic or sclerotic osseous abnormality. IMPRESSION: 1. Interval development of a 5.2 x 4.6 x 6.3 cm mixed attenuation lesion in the right adnexal space potentially representing poly cystic change in the ovary versus a dilated tortuous right Fallopian tube. Pelvic ultrasound recommended to further evaluate. 2. Slight progression of retroperitoneal and right common iliac adenopathy. Stranding is identified in the retroperitoneal fat around the aorta and IVC extending into the extraperitoneal fat along the common iliac vessels  bilaterally. Imaging features are nonspecific and could be reactive. Lymphoproliferative disorder not excluded. Close follow-up recommended. 3. Gallbladder wall is ill-defined with subtle pericholecystic edema. No calcified gallstones evident. Imaging features raise the question of acute cholecystitis. Right upper quadrant ultrasound recommended to further evaluate. 4. Stable 2.4 cm well-defined homogeneous cystic lesion arising from the uncinate process of the pancreas. Imaging features are nonspecific but could be related to a side branch IPMN. Follow-up outpatient nonemergent MRI abdomen with and without contrast recommended to further evaluate. 5.  Aortic Atherosclerosis (ICD10-I70.0). Electronically Signed   By: Donnal Fusi M.D.   On: 03/03/2024 11:22     Subjective: No acute issues or events overnight, tolerating p.o. now without difficulty, pain well-controlled, bowel movement noted overnight.   Discharge Exam: Vitals:   03/06/24 2021 03/07/24 0411  BP: 118/76 123/61  Pulse: 95 (!) 101  Resp: 18 18  Temp: 98.5 F (36.9 C) 100.3 F (37.9 C)  SpO2: 97% 93%   Vitals:   03/06/24 0927 03/06/24 1405 03/06/24 2021 03/07/24 0411  BP: 113/72 129/77 118/76 123/61  Pulse: 95 94 95 (!) 101  Resp: 18 18 18 18   Temp: 97.6 F (36.4 C) 98.3 F (36.8 C) 98.5 F (36.9 C) 100.3 F (37.9 C)  TempSrc: Oral Oral Oral Oral  SpO2: 95% 97% 97% 93%  Weight:      Height:        General: Pt is alert, awake, not in acute distress Cardiovascular: RRR, S1/S2 +, no rubs, no gallops Respiratory: CTA bilaterally, no wheezing, no rhonchi Abdominal: Postop incision site clean dry intact Extremities: no edema, no cyanosis    The results of significant diagnostics from this hospitalization (including imaging, microbiology, ancillary and laboratory)  are listed below for reference.     Microbiology: No results found for this or any previous visit (from the past 240 hours).   Labs: Basic Metabolic  Panel: Recent Labs  Lab 03/03/24 0844 03/04/24 0422 03/05/24 0215 03/06/24 0144 03/07/24 0614  NA 132* 134* 133* 132* 134*  K 3.5 3.4* 3.2* 3.3* 3.3*  CL 96* 97* 97* 97* 98  CO2 26 25 27 26 25   GLUCOSE 242* 205* 177* 139* 166*  BUN 12 6 6 8 7   CREATININE 0.84 0.57 0.59 0.72 0.70  CALCIUM 9.2 9.1 8.5* 8.2* 8.8*   Liver Function Tests: Recent Labs  Lab 03/03/24 0844 03/04/24 0422 03/05/24 0215 03/06/24 0144 03/07/24 0614  AST 15 15 13* 27 22  ALT 18 16 14 25 22   ALKPHOS 121 113 107 98 105  BILITOT 0.7 0.7 0.6 0.7 0.4  PROT 7.7 7.0 7.0 7.1 7.1  ALBUMIN 3.8 3.4* 3.1* 3.1* 3.1*   Recent Labs  Lab 03/03/24 0844  LIPASE 34   No results for input(s): "AMMONIA" in the last 168 hours. CBC: Recent Labs  Lab 03/03/24 0844 03/04/24 0422 03/05/24 0215 03/06/24 0144 03/07/24 0434  WBC 6.2 7.5 7.8 7.0 5.4  NEUTROABS 4.4  --   --   --   --   HGB 11.7* 12.2 11.6* 10.7* 11.6*  HCT 35.9* 36.3 34.8* 32.6* 35.9*  MCV 87.1 86.8 88.1 88.8 89.8  PLT 312 301 271 266 PLATELET CLUMPS NOTED ON SMEAR, UNABLE TO ESTIMATE   CBG: Recent Labs  Lab 03/06/24 1608 03/06/24 2019 03/07/24 0018 03/07/24 0410 03/07/24 0810  GLUCAP 186* 242* 178* 143* 166*   Urinalysis    Component Value Date/Time   COLORURINE YELLOW 03/03/2024 1120   APPEARANCEUR HAZY (A) 03/03/2024 1120   APPEARANCEUR Cloudy 02/22/2014 1557   LABSPEC 1.028 03/03/2024 1120   LABSPEC 1.021 02/22/2014 1557   PHURINE 7.0 03/03/2024 1120   GLUCOSEU >=500 (A) 03/03/2024 1120   GLUCOSEU Negative 02/22/2014 1557   HGBUR NEGATIVE 03/03/2024 1120   BILIRUBINUR NEGATIVE 03/03/2024 1120   BILIRUBINUR Negative 02/22/2014 1557   KETONESUR NEGATIVE 03/03/2024 1120   PROTEINUR NEGATIVE 03/03/2024 1120   NITRITE POSITIVE (A) 03/03/2024 1120   LEUKOCYTESUR NEGATIVE 03/03/2024 1120   LEUKOCYTESUR 2+ 02/22/2014 1557   Sepsis Labs Recent Labs  Lab 03/04/24 0422 03/05/24 0215 03/06/24 0144 03/07/24 0434  WBC 7.5 7.8 7.0  5.4   Microbiology No results found for this or any previous visit (from the past 240 hours).   Time coordinating discharge: Over 30 minutes  SIGNED:   Haydee Lipa, DO [facility] 03/07/2024, 2:56 PM Pager   If 7PM-7AM, please contact night-coverage [email or web address]

## 2024-09-29 ENCOUNTER — Emergency Department (HOSPITAL_COMMUNITY)

## 2024-09-29 ENCOUNTER — Encounter (HOSPITAL_COMMUNITY): Payer: Self-pay

## 2024-09-29 ENCOUNTER — Emergency Department (HOSPITAL_COMMUNITY)
Admission: EM | Admit: 2024-09-29 | Discharge: 2024-09-30 | Disposition: A | Discharge status: Home / Self Care | Attending: Emergency Medicine | Admitting: Emergency Medicine

## 2024-09-29 ENCOUNTER — Other Ambulatory Visit: Payer: Self-pay

## 2024-09-29 DIAGNOSIS — R0789 Other chest pain: Secondary | ICD-10-CM | POA: Diagnosis not present

## 2024-09-29 DIAGNOSIS — Z7951 Long term (current) use of inhaled steroids: Secondary | ICD-10-CM | POA: Diagnosis not present

## 2024-09-29 DIAGNOSIS — I10 Essential (primary) hypertension: Secondary | ICD-10-CM | POA: Insufficient documentation

## 2024-09-29 DIAGNOSIS — M62830 Muscle spasm of back: Secondary | ICD-10-CM | POA: Diagnosis not present

## 2024-09-29 DIAGNOSIS — Z91040 Latex allergy status: Secondary | ICD-10-CM | POA: Insufficient documentation

## 2024-09-29 DIAGNOSIS — T148XXA Other injury of unspecified body region, initial encounter: Secondary | ICD-10-CM

## 2024-09-29 DIAGNOSIS — E119 Type 2 diabetes mellitus without complications: Secondary | ICD-10-CM | POA: Diagnosis not present

## 2024-09-29 DIAGNOSIS — X500XXA Overexertion from strenuous movement or load, initial encounter: Secondary | ICD-10-CM | POA: Insufficient documentation

## 2024-09-29 DIAGNOSIS — J45909 Unspecified asthma, uncomplicated: Secondary | ICD-10-CM | POA: Insufficient documentation

## 2024-09-29 DIAGNOSIS — S161XXA Strain of muscle, fascia and tendon at neck level, initial encounter: Secondary | ICD-10-CM | POA: Diagnosis not present

## 2024-09-29 DIAGNOSIS — Y990 Civilian activity done for income or pay: Secondary | ICD-10-CM | POA: Insufficient documentation

## 2024-09-29 DIAGNOSIS — S199XXA Unspecified injury of neck, initial encounter: Secondary | ICD-10-CM | POA: Diagnosis present

## 2024-09-29 DIAGNOSIS — Z79899 Other long term (current) drug therapy: Secondary | ICD-10-CM | POA: Insufficient documentation

## 2024-09-29 DIAGNOSIS — M25511 Pain in right shoulder: Secondary | ICD-10-CM | POA: Diagnosis not present

## 2024-09-29 LAB — CBC
HCT: 36.5 % (ref 36.0–46.0)
Hemoglobin: 12 g/dL (ref 12.0–15.0)
MCH: 28.5 pg (ref 26.0–34.0)
MCHC: 32.9 g/dL (ref 30.0–36.0)
MCV: 86.7 fL (ref 80.0–100.0)
Platelets: 284 K/uL (ref 150–400)
RBC: 4.21 MIL/uL (ref 3.87–5.11)
RDW: 13.6 % (ref 11.5–15.5)
WBC: 6.3 K/uL (ref 4.0–10.5)
nRBC: 0 % (ref 0.0–0.2)

## 2024-09-29 LAB — BASIC METABOLIC PANEL WITH GFR
Anion gap: 12 (ref 5–15)
BUN: 17 mg/dL (ref 6–20)
CO2: 29 mmol/L (ref 22–32)
Calcium: 10.4 mg/dL — ABNORMAL HIGH (ref 8.9–10.3)
Chloride: 98 mmol/L (ref 98–111)
Creatinine, Ser: 0.8 mg/dL (ref 0.44–1.00)
GFR, Estimated: >60 mL/min
Glucose, Bld: 202 mg/dL — ABNORMAL HIGH (ref 70–99)
Potassium: 3.5 mmol/L (ref 3.5–5.1)
Sodium: 138 mmol/L (ref 135–145)

## 2024-09-29 LAB — HCG, SERUM, QUALITATIVE: Preg, Serum: NEGATIVE

## 2024-09-29 LAB — TROPONIN T, HIGH SENSITIVITY: Troponin T High Sensitivity: <15 ng/L (ref 0–19)

## 2024-09-29 MED ORDER — KETOROLAC TROMETHAMINE 15 MG/ML IJ SOLN
15.0000 mg | Freq: Once | INTRAMUSCULAR | Status: AC
  Administered 2024-09-30: 15 mg via INTRAVENOUS
  Filled 2024-09-29: qty 1

## 2024-09-29 MED ORDER — METHOCARBAMOL 500 MG PO TABS
750.0000 mg | ORAL_TABLET | Freq: Once | ORAL | Status: AC
  Administered 2024-09-30: 750 mg via ORAL
  Filled 2024-09-29: qty 2

## 2024-09-29 MED ORDER — KETOROLAC TROMETHAMINE 60 MG/2ML IM SOLN: 30.0000 mg | Freq: Once | INTRAMUSCULAR | Status: DC

## 2024-09-29 NOTE — ED Notes (Signed)
 ED Provider at bedside.

## 2024-09-29 NOTE — ED Triage Notes (Signed)
 Pt. Bib ems for right sided chest pain that radiates to the neck. Denies SOB. Denies nausea/vomiting. Took 325 mg asa prior to EMS arrival.

## 2024-09-30 DIAGNOSIS — S161XXA Strain of muscle, fascia and tendon at neck level, initial encounter: Secondary | ICD-10-CM | POA: Diagnosis not present

## 2024-09-30 MED ORDER — NAPROXEN 375 MG PO TABS: 375.0000 mg | ORAL_TABLET | Freq: Two times a day (BID) | ORAL | 0 refills | Status: AC

## 2024-09-30 MED ORDER — METHOCARBAMOL 500 MG PO TABS: 500.0000 mg | ORAL_TABLET | Freq: Three times a day (TID) | ORAL | 0 refills | Status: AC | PRN

## 2024-09-30 NOTE — Discharge Instructions (Signed)

## 2024-09-30 NOTE — ED Provider Notes (Signed)
 " [facility] AT [facility] Provider Note  CSN: [id number] Arrival date & time: 09/29/24 2126  Chief Complaint(s) Chest Pain  History provided by patient. HPI & MDM Lauren Bradford is a 48 y.o. female here for.   Chest Pain  Patient reports that she began having left axillary pain that spread to her upper back and neck.  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  She did report irritating the muscles in the axillary region earlier last week while her husband was lifting her out of the floor while they were working out.  Patient denies any fall or trauma.  Pain is worse with movement, palpation and range of motion of the shoulder girdle region. Alleviated by mobility.  She denies any recent fevers or infections.  No coughing or congestion.  No nausea or vomiting. Patient denies any recent travel or prolonged immobilization. No prior history of DVT/PEs. She is not on OCP.  Clinical Course as of 09/30/24 0011  Tue Sep 30, 2024  0006 Differential diagnosis considered and workup below  Presentation and exam are most consistent with right shoulder girdle muscle strain/spasm.  EKG without acute ischemic changes.  Initial troponin negative.  Given the onset and duration of pain, feel this is sufficient to rule out ACS and his clinical picture.  Chest x-ray without evidence of pneumonia, pneumothorax, pulmonary edema pleural effusion.  Presentation is not classic for aortic dissection or esophageal perforation.  Low suspicion for pulmonary embolism..  Treat symptomatically with IV Toradol  and muscle relaxer.  Supportive management at home. [PC]    Clinical Course User Index [PC] Nabria Nevin, Raynell Moder, MD   Medical Decision Making Risk Prescription drug management.    Final Clinical Impression(s) / ED Diagnoses Final diagnoses:  Pain of right shoulder region  Muscle strain   The patient appears reasonably screened and/or  stabilized for discharge and I doubt any other medical condition or other [facility] requiring further screening, evaluation, or treatment in the ED at this time. I have discussed the findings, Dx and Tx plan with the patient/family who expressed understanding and agree(s) with the plan. Discharge instructions discussed at length. The patient/family was given strict return precautions who verbalized understanding of the instructions. No further questions at time of discharge.  Disposition: Discharge  Condition: Good  ED Discharge Orders          Ordered    methocarbamol  (ROBAXIN ) 500 MG tablet  Every 8 hours PRN        09/30/24 0002    naproxen  (NAPROSYN ) 375 MG tablet  2 times daily        09/30/24 0002              Follow Up: Primary care provider  Call  to schedule an appointment for close follow up     Past Medical History Past Medical History:  Diagnosis Date   Advanced stage glaucoma    limited vision   Asthma    Glaucoma    Hypertension    Legal blindness    Ovarian cyst ER   right   Type 2 diabetes mellitus [facility])    Patient Active Problem List   Diagnosis Date Noted   Glaucoma 03/04/2024   Aortic atherosclerosis 03/04/2024   Acute cholecystitis 03/03/2024   Hypertensive urgency 03/03/2024   Pancreatic lesion 03/03/2024   Type 2 diabetes mellitus (HCC)    Right ovarian cyst 07/21/2015   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 11/03/2014   Asthma  03/31/2013   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 Authorizing Provider  methocarbamol  (ROBAXIN ) 500 MG tablet Take 1-2 tablets (500-1,000 mg total) by mouth every 8 (eight) hours as needed for muscle spasms. 09/30/24  Yes Renzo Vincelette, Raynell Moder, MD  naproxen  (NAPROSYN ) 375 MG tablet Take 1 tablet  (375 mg total) by mouth 2 (two) times daily. 09/30/24  Yes Demontez Novack, Raynell Moder, MD  albuterol  (PROVENTIL  HFA;VENTOLIN  HFA) 108 (90 BASE) MCG/ACT inhaler Inhale 2 puffs into the lungs every 6 (six) hours as needed for wheezing or shortness of breath.     [provider]  brimonidine  (ALPHAGAN  P) 0.1 % SOLN Place 1 drop into both eyes in the morning and at bedtime. 09/12/14   [provider]  Difluprednate  0.05 % EMUL Place 1 drop into the right eye 2 (two) times daily.  Patient not taking: Reported on 03/04/2024 09/21/14   [provider]  fluticasone (FLONASE) 50 MCG/ACT nasal spray Place 1 spray into both nostrils daily.    [provider]  olmesartan-hydrochlorothiazide  (BENICAR HCT) 40-25 MG tablet Take 1 tablet by mouth daily. 02/20/24   [provider]  ondansetron  (ZOFRAN -ODT) 4 MG disintegrating tablet Take 1 tablet (4 mg total) by mouth every 8 (eight) hours as needed for nausea or vomiting. Patient taking differently: Take 4 mg by mouth as needed for nausea or vomiting. 08/12/23   Smoot, Lauraine LABOR, PA-C  pantoprazole  (PROTONIX ) 40 MG tablet Take 1 tablet (40 mg total) by mouth daily. 03/08/24   Lue Elsie BROCKS, MD  polyethylene glycol powder (GLYCOLAX /MIRALAX ) 17 GM/SCOOP powder Take 17 grams dissolved in liquid by mouth 2 (two) times daily. 03/07/24   Lue Elsie BROCKS, MD  senna-docusate (SENOKOT-S) 8.6-50 MG tablet Take 1 tablet by mouth 2 (two) times daily. 03/07/24   Lue Elsie BROCKS, MD  TRESIBA FLEXTOUCH 100 UNIT/ML FlexTouch Pen Inject 40 Units into the skin at bedtime. 11/22/20   [provider]                                                                                                                                    Allergies Prednisone, Latex, and Yeast-derived drug products  Review of Systems Review of Systems  Cardiovascular:  Positive for chest pain.   As noted in HPI  Physical Exam Vital Signs  I have  reviewed the triage vital signs BP (!) 146/100 (BP Location: Left Arm)   Pulse 86   Temp 98.1 F (36.7 C) (Oral)   Resp 15   SpO2 98%   Physical Exam Vitals reviewed.  Constitutional:      General: She is not in acute distress.    Appearance: She is well-developed. She is not diaphoretic.  HENT:     Head: Normocephalic and atraumatic.     Right Ear: External ear normal.     Left Ear: External ear normal.     Nose: Nose normal.  Eyes:     General: No scleral icterus.    Conjunctiva/sclera: Conjunctivae normal.  Neck:     Trachea: Phonation normal.  Cardiovascular:     Rate and Rhythm: Normal rate and regular rhythm.  Pulmonary:     Effort: Pulmonary effort is normal. No respiratory distress.     Breath sounds: No stridor.  Chest:     Chest wall: Tenderness present.    Abdominal:     General: There is no distension.  Musculoskeletal:        General: Normal range of motion.     Right shoulder: Tenderness present. No bony tenderness. Normal range of motion. Normal strength. Normal pulse.     Left shoulder: Normal strength. Normal pulse.     Cervical back: Normal range of motion. Spasms present.     Thoracic back: Spasms present.       Back:  Neurological:     Mental Status: She is alert and oriented to person, place, and time.  Psychiatric:        Behavior: Behavior normal.     ED Results and Treatments Labs (all labs ordered are listed, but only abnormal results are displayed) Labs Reviewed  BASIC METABOLIC PANEL WITH GFR - Abnormal; Notable for the following components:      Result Value   Glucose, Bld 202 (*)    Calcium 10.4 (*)    All other components within normal limits  CBC  HCG, SERUM, QUALITATIVE  TROPONIN T, HIGH SENSITIVITY                                                                                                                         EKG  EKG Interpretation Date/Time:  Monday September 29 2024 21:43:06 EST Ventricular Rate:  83 PR  Interval:  169 QRS Duration:  90 QT Interval:  366 QTC Calculation: 430 R Axis:   -22  Text Interpretation: Sinus rhythm Borderline left axis deviation Low voltage, precordial leads Consider anterior infarct No significant change was found Confirmed by Trine Likes [phone]) on 09/29/2024 11:07:12 PM       Radiology DG Chest 2 View Result Date: 09/29/2024 CLINICAL DATA:  Chest pain. EXAM: CHEST - 2 VIEW COMPARISON:  08/17/2023 FINDINGS: Low lung volumes limit assessment. The heart is normal in size for technique. No focal opacity, pleural effusion, or pneumothorax. No acute osseous findings. IMPRESSION: Low lung volumes without acute abnormality. Electronically Signed   By: Andrea Gasman M.D.   On: 09/29/2024 22:19    Medications Ordered in ED Medications  methocarbamol  (ROBAXIN ) tablet 750 mg (has no administration in time range)  ketorolac  (TORADOL ) 15 MG/ML injection 15 mg (has no administration in time range)   Procedures Procedures  (including critical care time)   This chart was  dictated using voice recognition software.  Despite best efforts to proofread,  errors can occur which can change the documentation meaning.   Trine Raynell Moder, MD 09/30/24 [phone]  "
